# Patient Record
Sex: Female | Born: 1960 | Race: White | Hispanic: No | Marital: Married | Smoking: Never smoker
Health system: Southern US, Community
[De-identification: ages and names within clinical notes are randomized; demographics above are authoritative.]

## PROBLEM LIST (undated history)

## (undated) DIAGNOSIS — C50919 Malignant neoplasm of unspecified site of unspecified female breast: Secondary | ICD-10-CM

## (undated) DIAGNOSIS — C73 Malignant neoplasm of thyroid gland: Secondary | ICD-10-CM

## (undated) DIAGNOSIS — J40 Bronchitis, not specified as acute or chronic: Secondary | ICD-10-CM

## (undated) HISTORY — DX: Malignant neoplasm of unspecified site of unspecified female breast: C50.919

## (undated) HISTORY — DX: Malignant neoplasm of thyroid gland: C73

## (undated) HISTORY — PX: BREAST EXCISIONAL BIOPSY: SUR124

## (undated) HISTORY — PX: THYROID SURGERY: SHX805

## (undated) HISTORY — PX: TONSILLECTOMY: SUR1361

---

## 2015-03-16 DIAGNOSIS — C73 Malignant neoplasm of thyroid gland: Secondary | ICD-10-CM | POA: Insufficient documentation

## 2015-07-07 DIAGNOSIS — E039 Hypothyroidism, unspecified: Secondary | ICD-10-CM | POA: Insufficient documentation

## 2015-07-07 DIAGNOSIS — R944 Abnormal results of kidney function studies: Secondary | ICD-10-CM | POA: Insufficient documentation

## 2016-06-03 HISTORY — PX: BREAST LUMPECTOMY: SHX2

## 2016-06-03 HISTORY — PX: BREAST BIOPSY: SHX20

## 2016-09-12 DIAGNOSIS — G510 Bell's palsy: Secondary | ICD-10-CM | POA: Insufficient documentation

## 2016-09-12 DIAGNOSIS — N39490 Overflow incontinence: Secondary | ICD-10-CM | POA: Insufficient documentation

## 2016-09-12 DIAGNOSIS — E7800 Pure hypercholesterolemia, unspecified: Secondary | ICD-10-CM | POA: Insufficient documentation

## 2016-11-01 HISTORY — PX: BREAST BIOPSY: SHX20

## 2018-03-03 HISTORY — PX: BREAST BIOPSY: SHX20

## 2018-06-03 HISTORY — PX: BREAST LUMPECTOMY: SHX2

## 2019-02-25 ENCOUNTER — Encounter: Payer: Self-pay | Admitting: Oncology

## 2019-02-25 ENCOUNTER — Other Ambulatory Visit: Payer: Self-pay

## 2019-02-25 NOTE — Progress Notes (Signed)
Called patient for new consult visit tomorrow.  Reviewed chart with pt.

## 2019-02-26 ENCOUNTER — Other Ambulatory Visit: Payer: Self-pay

## 2019-02-26 ENCOUNTER — Encounter (INDEPENDENT_AMBULATORY_CARE_PROVIDER_SITE_OTHER): Payer: Self-pay

## 2019-02-26 ENCOUNTER — Encounter: Payer: Self-pay | Admitting: Oncology

## 2019-02-26 ENCOUNTER — Inpatient Hospital Stay: Payer: BC Managed Care – PPO | Attending: Oncology | Admitting: Oncology

## 2019-02-26 VITALS — BP 138/72 | HR 57 | Temp 98.1°F | Ht 61.0 in | Wt 138.9 lb

## 2019-02-26 DIAGNOSIS — Z85850 Personal history of malignant neoplasm of thyroid: Secondary | ICD-10-CM | POA: Diagnosis not present

## 2019-02-26 DIAGNOSIS — D0511 Intraductal carcinoma in situ of right breast: Secondary | ICD-10-CM | POA: Insufficient documentation

## 2019-02-26 DIAGNOSIS — Z170 Estrogen receptor positive status [ER+]: Secondary | ICD-10-CM | POA: Insufficient documentation

## 2019-02-26 DIAGNOSIS — C73 Malignant neoplasm of thyroid gland: Secondary | ICD-10-CM

## 2019-02-26 DIAGNOSIS — Z801 Family history of malignant neoplasm of trachea, bronchus and lung: Secondary | ICD-10-CM

## 2019-02-26 DIAGNOSIS — Z808 Family history of malignant neoplasm of other organs or systems: Secondary | ICD-10-CM

## 2019-02-26 DIAGNOSIS — Z923 Personal history of irradiation: Secondary | ICD-10-CM

## 2019-02-26 DIAGNOSIS — Z79811 Long term (current) use of aromatase inhibitors: Secondary | ICD-10-CM | POA: Insufficient documentation

## 2019-02-26 NOTE — Progress Notes (Signed)
[place]  [phone] [phone]  ID: Kathy Ruiz OB: September 07, 1960  MR#: [id number]  JKD#:[id number]  Patient Care Team: Leonel Ramsay, MD as PCP - General (Infectious Diseases)  CHIEF COMPLAINT: DCIS right breast, history of papillary thyroid cancer.  INTERVAL HISTORY: Patient is a 58 year old female who recently moved to [facility] from [place] who is here to establish care.  She was diagnosed with DCIS in 2018 and underwent right lumpectomy followed by adjuvant XRT in May 2018.  She was subsequently placed on letrozole.  She also has a distant history of papillary thyroid carcinoma requiring multiple surgeries and radioactive iodine for removal most recently in 2004.  She currently feels well and is asymptomatic.  She has no neurologic complaints.  She denies any recent fevers or illnesses.  She has a good appetite and denies weight loss.  She has no chest pain, shortness of breath, cough, or hemoptysis.  She has no nausea, vomiting, constipation, diarrhea.  She has no urinary complaints.  Patient feels at her baseline offers no specific complaints today.  REVIEW OF SYSTEMS:   Review of Systems  Constitutional: Negative.  Negative for fever, malaise/fatigue and weight loss.  Respiratory: Negative.  Negative for cough, hemoptysis and shortness of breath.   Cardiovascular: Negative.  Negative for chest pain and leg swelling.  Gastrointestinal: Negative.  Negative for abdominal pain.  Genitourinary: Negative.  Negative for frequency.  Musculoskeletal: Negative.  Negative for back pain.  Skin: Negative.  Negative for rash.  Neurological: Negative.  Negative for dizziness, focal weakness, weakness and headaches.  Psychiatric/Behavioral: Negative.  The patient is not nervous/anxious.     As per HPI. Otherwise, a complete review of systems is negative.  PAST MEDICAL HISTORY: Past Medical History:  Diagnosis Date  . Breast cancer  ([place])   . Thyroid cancer ([place])     PAST SURGICAL HISTORY: Past Surgical History:  Procedure Laterality Date  . BREAST BIOPSY    . THYROID SURGERY    . TONSILLECTOMY      FAMILY HISTORY: Family History  Problem Relation Age of Onset  . Lung cancer Mother   . Throat cancer Maternal Aunt     ADVANCED DIRECTIVES (Y/N):  N  HEALTH MAINTENANCE: Social History   Tobacco Use  . Smoking status: Never Smoker  . Smokeless tobacco: Never Used  Substance Use Topics  . Alcohol use: Yes    Alcohol/week: 2.0 standard drinks    Types: 1 Glasses of wine, 1 Shots of liquor per week    Comment: mixture, 1 per wk   . Drug use: Not Currently     Colonoscopy:  PAP:  Bone density:  Lipid panel:  Allergies  Allergen Reactions  . Ibuprofen     Bleeding ulcer    Current Outpatient Medications  Medication Sig Dispense Refill  . Ascorbic Acid (VITAMIN C) 1000 MG tablet Take 1,000 mg by mouth daily.    . Biotin 1000 MCG CHEW Chew 1,000 mcg by mouth.    . Calcium-Magnesium-Vitamin D (CALCIUM MAGNESIUM PO) Take by mouth.    . Coenzyme Q10 (CO Q 10 PO) Take by mouth.    . letrozole (FEMARA) 2.5 MG tablet Take 2.5 mg by mouth daily.    Marland Kitchen levothyroxine (LEVOXYL) 100 MCG tablet Take 100 mcg by mouth daily before breakfast.    . Multiple Vitamins-Minerals (CENTRUM ADULTS PO) Take by mouth.    . Omega-3 Fatty Acids (FISH OIL) 1200 MG CAPS Take by mouth.    Marland Kitchen  VITAMIN D PO Take 5,000 Units by mouth.     No current facility-administered medications for this visit.     OBJECTIVE: Vitals:   02/26/19 0922  BP: 138/72  Pulse: (!) 57  Temp: 98.1 F (36.7 C)     Body mass index is 26.24 kg/m.    ECOG FS:0 - Asymptomatic  General: Well-developed, well-nourished, no acute distress. Eyes: Pink conjunctiva, anicteric sclera. HEENT: Normocephalic, moist mucous membranes, clear oropharnyx.  No palpable lymphadenopathy or evidence of recurrence. Breast: Bilateral breast and axilla without lumps  or masses. Lungs: Clear to auscultation bilaterally. Heart: Regular rate and rhythm. No rubs, murmurs, or gallops. Abdomen: Soft, nontender, nondistended. No organomegaly noted, normoactive bowel sounds. Musculoskeletal: No edema, cyanosis, or clubbing. Neuro: Alert, answering all questions appropriately. Cranial nerves grossly intact. Skin: No rashes or petechiae noted. Psych: Normal affect. Lymphatics: No cervical, calvicular, axillary or inguinal LAD.   LAB RESULTS:  No results found for: NA, K, CL, CO2, GLUCOSE, BUN, CREATININE, CALCIUM, PROT, ALBUMIN, AST, ALT, ALKPHOS, BILITOT, GFRNONAA, GFRAA  No results found for: WBC, NEUTROABS, HGB, HCT, MCV, PLT   STUDIES: No results found.  ASSESSMENT: DCIS right breast, history of papillary thyroid cancer.  PLAN:    1.  DCIS right breast: Patient was diagnosed in 2018 and underwent right lumpectomy followed by adjuvant XRT in May 2018.  She was subsequently placed on letrozole and will require 5 years of treatment completing in May 2023.  She reports her most recent mammogram was greater than 1 year ago, therefore will get diagnostic mammogram in the next 1 to 2 weeks.  Return to clinic in 6 months for routine evaluation. 2.  History of papillary thyroid cancer: Initially diagnosed in 1999 and patient had 2 re-excisions since then most recently in 2004.  She also reports she underwent radioactive iodine treatments.  No intervention is needed at this time.  Continue to monitor thyroglobulin antibodies as well as TSH and thyroid panel. 3.  Bone health: Patient reports she has a bone mineral density later today.  I spent a total of 60 minutes face-to-face with the patient of which greater than 50% of the visit was spent in counseling and coordination of care as detailed above.  Patient expressed understanding and was in agreement with this plan. She also understands that She can call clinic at any time with any questions, concerns, or  complaints.   Cancer Staging Ductal carcinoma in situ (DCIS) of right breast Staging form: Breast, AJCC 8th Edition - Clinical stage from 02/26/2019: Stage 0 (cTis (DCIS), cN0, cM0, ER+, PR: Unknown, HER2: Unknown) - Signed by Lloyd Huger, MD on 02/26/2019   Lloyd Huger, MD   02/26/2019 1:16 PM

## 2019-02-26 NOTE — Progress Notes (Signed)
Patient is here today to establish care for her right breast cancer.

## 2019-04-21 ENCOUNTER — Ambulatory Visit
Admission: RE | Admit: 2019-04-21 | Discharge: 2019-04-21 | Disposition: A | Discharge status: Home / Self Care | Payer: BC Managed Care – PPO | Source: Ambulatory Visit | Attending: Oncology | Admitting: Oncology

## 2019-04-21 ENCOUNTER — Encounter: Payer: Self-pay | Admitting: Oncology

## 2019-04-21 DIAGNOSIS — D0511 Intraductal carcinoma in situ of right breast: Secondary | ICD-10-CM | POA: Diagnosis not present

## 2019-04-22 ENCOUNTER — Other Ambulatory Visit: Payer: Self-pay | Admitting: Emergency Medicine

## 2019-04-22 DIAGNOSIS — D0511 Intraductal carcinoma in situ of right breast: Secondary | ICD-10-CM

## 2019-04-27 ENCOUNTER — Ambulatory Visit (INDEPENDENT_AMBULATORY_CARE_PROVIDER_SITE_OTHER): Payer: BC Managed Care – PPO | Admitting: Surgery

## 2019-04-27 ENCOUNTER — Other Ambulatory Visit: Payer: Self-pay

## 2019-04-27 ENCOUNTER — Encounter: Payer: Self-pay | Admitting: Surgery

## 2019-04-27 VITALS — BP 123/79 | HR 62 | Temp 98.0°F | Resp 14 | Ht 62.0 in | Wt 140.8 lb

## 2019-04-27 DIAGNOSIS — N6489 Other specified disorders of breast: Secondary | ICD-10-CM

## 2019-04-27 NOTE — H&P (View-Only) (Signed)
Patient ID: Kathy Ruiz, female   DOB: 04-27-1961, 58 y.o.   MRN: [id number]  Chief Complaint: Radial scar left breast.  History of Present Illness [name] is a 58 y.o. female with history of right-sided ductal carcinoma in situ following breast conservation in 2018.  She continues to take letrozole.  She recently had follow-up mammography which revisits a known radial scar on the left breast.  Although this was previously biopsied, excisional biopsy was not pursued.  She notes no particular change in the left breast, and none is reported radiographically.  She has been encouraged to have excisional biopsy of this area.   Past Medical History Past Medical History:  Diagnosis Date  . Breast cancer ([place])   . Thyroid cancer [facility])       Past Surgical History:  Procedure Laterality Date  . BREAST BIOPSY Right 2018   dcis  . BREAST BIOPSY Left 11/2016   MRI bx Sclerosing Adenosis  . BREAST BIOPSY Left 03/2018   stereo radial sclerosing lesion, fibroadenomatoid lesion  . BREAST EXCISIONAL BIOPSY Left    age 60  . BREAST LUMPECTOMY Right 2018   dcis  . THYROID SURGERY    . TONSILLECTOMY      Allergies  Allergen Reactions  . Ibuprofen     Bleeding ulcer    Current Outpatient Medications  Medication Sig Dispense Refill  . albuterol (PROAIR HFA) 108 (90 Base) MCG/ACT inhaler ProAir HFA 90 mcg/actuation aerosol inhaler    . Ascorbic Acid (VITAMIN C) 1000 MG tablet Take 1,000 mg by mouth daily.    . Biotin 1000 MCG CHEW Chew 1,000 mcg by mouth.    . Calcium-Magnesium 100-50 MG TABS Calcium Magnesium    . Calcium-Magnesium-Vitamin D (CALCIUM MAGNESIUM PO) Take by mouth.    . Coenzyme Q10 (CO Q 10 PO) Take by mouth.    . letrozole (FEMARA) 2.5 MG tablet Take 2.5 mg by mouth daily.    Marland Kitchen levothyroxine (LEVOXYL) 100 MCG tablet Take 100 mcg by mouth daily before breakfast.    . Magnesium Chloride-Calcium 64-106 MG TBEC Take by mouth.    . Multiple Vitamins-Minerals (CENTRUM  ADULTS PO) Take by mouth.    . Omega-3 Fatty Acids (FISH OIL) 1200 MG CAPS Take by mouth.     No current facility-administered medications for this visit.     Family History Family History  Problem Relation Age of Onset  . Lung cancer Mother   . Throat cancer Maternal Aunt   . Breast cancer Cousin 50      Social History Social History   Tobacco Use  . Smoking status: Never Smoker  . Smokeless tobacco: Never Used  Substance Use Topics  . Alcohol use: Yes    Alcohol/week: 2.0 standard drinks    Types: 1 Glasses of wine, 1 Shots of liquor per week    Comment: mixture, 1 per wk   . Drug use: Never        Review of Systems  Constitutional: Negative.   HENT: Negative.   Eyes: Negative.   Respiratory: Negative.   Cardiovascular: Negative.   Gastrointestinal: Negative.   Genitourinary: Negative.   Skin: Negative.   Neurological: Negative.   Endo/Heme/Allergies: Negative.       Physical Exam Blood pressure 123/79, pulse 62, temperature 98 F (36.7 C), temperature source Temporal, resp. rate 14, height 5\' 2"  (1.575 m), weight 140 lb 12.8 oz (63.9 kg), SpO2 98 %. Last Weight  Most recent update: 04/27/2019  1:38 PM  Weight  63.9 kg (140 lb 12.8 oz)            CONSTITUTIONAL: Well developed, and nourished, appropriately responsive and aware without distress.   EYES: Sclera non-icteric.   EARS, NOSE, MOUTH AND THROAT: Mask worn.  NECK: Trachea is midline, and there is no jugular venous distension.  LYMPH NODES:  Lymph nodes in the neck are not enlarged. RESPIRATORY:  Lungs are clear, and breath sounds are equal bilaterally. Normal respiratory effort without pathologic use of accessory muscles. CARDIOVASCULAR: Heart is regular in rate and rhythm. GI: The abdomen is  soft, nontender, and nondistended.  GU:  Remarkable paucity of scarring and radiation changes to the right breast.  The left breast is unremarkable, there is no evidence of suspicious nodularity, density  skin changes or mass.  Nipples everted without evidence of discharge.  No gross axillary adenopathy. MUSCULOSKELETAL:  Symmetrical muscle tone appreciated in all four extremities.    SKIN: Skin turgor is normal. No pathologic skin lesions appreciated.  NEUROLOGIC:  Motor and sensation appear grossly normal.  Cranial nerves are grossly without defect. PSYCH:  Alert and oriented to person, place and time. Affect is appropriate for situation.  Data Reviewed I have personally reviewed what is currently available of the patient's imaging, recent labs and medical records.   CLINICAL DATA:  History of right breast cancer in 2018 status post lumpectomy. History of stereotactic left breast biopsy in April 2019 demonstrating radial sclerosing lesion which was not excised. Additional history of benign MRI guided biopsy in June 2018 demonstrating benign sclerosing adenosis, UDH and fat necrosis.  EXAM: DIGITAL DIAGNOSTIC BILATERAL MAMMOGRAM WITH CAD AND TOMO  ULTRASOUND LEFT BREAST  COMPARISON:  Previous exam(s).  ACR Breast Density Category b: There are scattered areas of fibroglandular density.  FINDINGS: Mammogram:  Right breast: There are postsurgical changes following lumpectomy in the medial right breast. No suspicious mass, distortion, or microcalcifications are identified to suggest presence of malignancy.  Left breast: There are two biopsy marking clips, a cylinder in the anterior slightly superior breast and a butterfly clip in the upper outer breast. In the upper outer breast there is a persistent small area of distortion at the butterfly clip, likely corresponding to the biopsy-proven radial sclerosing lesion. There is mildly increased density in this location which may represent biopsy change. The biopsy marking clip on the MLO appears displaced by approximately 1 cm superior to the center of the distortion (which was also noted on a prior outside report). No other  findings identified in the left breast.  Mammographic images were processed with CAD.  Ultrasound:  Targeted ultrasound is performed in the upper outer left breast at 1 o'clock 5 cm from the nipple demonstrating a small irregular mass measuring 0.7 x 0.5 x 0.6 cm with an associated echogenic focus, likely the biopsy marking clip.  IMPRESSION: 1. At the site of biopsy proven radial sclerosing lesion, which was not excised, in the upper outer left breast (butterfly clip) there is distortion which is not significantly changed. There is a small amount of increased density and sonographically there is a small irregular possibly representing post biopsy change. Of note the biopsy marking clip appears displaced approximately 1 cm from the center of the distortion on the MLO view.  2. Expected postoperative changes in the right breast. No mammographic evidence of malignancy in the right breast.  RECOMMENDATION: 1. Follow-up left breast diagnostic mammogram and ultrasound in 6 months to confirm stability of the likely post biopsy  changes in the upper outer breast.  2. Recommend surgical consultation for consideration of excision of the radial sclerosing lesion in the left breast.  I have discussed the findings and recommendations with the patient. If applicable, a reminder letter will be sent to the patient regarding the next appointment.  BI-RADS CATEGORY  3: Probably benign.   Electronically Signed   By: Audie Pinto M.D.   On: 04/21/2019 11:23  Assessment    Radial sclerosing lesion left breast.  Seemingly unchanged from prior imaging. Patient Active Problem List   Diagnosis Date Noted  . Ductal carcinoma in situ (DCIS) of right breast 02/26/2019  . Thyroid cancer ([place]) 02/26/2019  . Bell's palsy 09/12/2016  . High cholesterol 09/12/2016  . Incontinence overflow, urine 09/12/2016  . Acquired hypothyroidism 07/07/2015  . Decreased GFR 07/07/2015     Plan    Needle localized left breast biopsy of the lesion in question to enable further peace of mind for this patient.  Risks and benefits of the above procedure been discussed, including the details of how the wires placed.  We also talks about an anxiolytics for her procedure.  She would like to proceed.  Risks of anesthesia, bleeding, bruising, missing the lesion and need for additional biopsy discussed in detail.  She understands that the likelihood of this biopsy leans heavily toward confirming its benignity but feels it is well worthwhile considering the risk  Face-to-face time spent with the patient and accompanying care providers(if present) was 30 minutes, with more than 50% of the time spent counseling, educating, and coordinating care of the patient.      Ronny Bacon 04/27/2019, 3:05 PM

## 2019-04-27 NOTE — Progress Notes (Signed)
Patient ID: Kathy Ruiz, female   DOB: [birth year], 58 y.o.   MRN: [id number]  Chief Complaint: Radial scar left breast.  History of Present Illness Kathy Ruiz is a 58 y.o. female with history of right-sided ductal carcinoma in situ following breast conservation in 2018.  She continues to take letrozole.  She recently had follow-up mammography which revisits a known radial scar on the left breast.  Although this was previously biopsied, excisional biopsy was not pursued.  She notes no particular change in the left breast, and none is reported radiographically.  She has been encouraged to have excisional biopsy of this area.   Past Medical History Past Medical History:  Diagnosis Date  . Breast cancer ([place])   . Thyroid cancer [facility])       Past Surgical History:  Procedure Laterality Date  . BREAST BIOPSY Right 2018   dcis  . BREAST BIOPSY Left 11/2016   MRI bx Sclerosing Adenosis  . BREAST BIOPSY Left 03/2018   stereo radial sclerosing lesion, fibroadenomatoid lesion  . BREAST EXCISIONAL BIOPSY Left    age 46  . BREAST LUMPECTOMY Right 2018   dcis  . THYROID SURGERY    . TONSILLECTOMY      Allergies  Allergen Reactions  . Ibuprofen     Bleeding ulcer    Current Outpatient Medications  Medication Sig Dispense Refill  . albuterol (PROAIR HFA) 108 (90 Base) MCG/ACT inhaler ProAir HFA 90 mcg/actuation aerosol inhaler    . Ascorbic Acid (VITAMIN C) 1000 MG tablet Take 1,000 mg by mouth daily.    . Biotin 1000 MCG CHEW Chew 1,000 mcg by mouth.    . Calcium-Magnesium 100-50 MG TABS Calcium Magnesium    . Calcium-Magnesium-Vitamin D (CALCIUM MAGNESIUM PO) Take by mouth.    . Coenzyme Q10 (CO Q 10 PO) Take by mouth.    . letrozole (FEMARA) 2.5 MG tablet Take 2.5 mg by mouth daily.    Marland Kitchen levothyroxine (LEVOXYL) 100 MCG tablet Take 100 mcg by mouth daily before breakfast.    . Magnesium Chloride-Calcium 64-106 MG TBEC Take by mouth.    . Multiple Vitamins-Minerals (CENTRUM  ADULTS PO) Take by mouth.    . Omega-3 Fatty Acids (FISH OIL) 1200 MG CAPS Take by mouth.     No current facility-administered medications for this visit.     Family History Family History  Problem Relation Age of Onset  . Lung cancer Mother   . Throat cancer Maternal Aunt   . Breast cancer Cousin 72      Social History Social History   Tobacco Use  . Smoking status: Never Smoker  . Smokeless tobacco: Never Used  Substance Use Topics  . Alcohol use: Yes    Alcohol/week: 2.0 standard drinks    Types: 1 Glasses of wine, 1 Shots of liquor per week    Comment: mixture, 1 per wk   . Drug use: Never        Review of Systems  Constitutional: Negative.   HENT: Negative.   Eyes: Negative.   Respiratory: Negative.   Cardiovascular: Negative.   Gastrointestinal: Negative.   Genitourinary: Negative.   Skin: Negative.   Neurological: Negative.   Endo/Heme/Allergies: Negative.       Physical Exam Blood pressure 123/79, pulse 62, temperature 98 F (36.7 C), temperature source Temporal, resp. rate 14, height 5\' 2"  (1.575 m), weight 140 lb 12.8 oz (63.9 kg), SpO2 98 %. Last Weight  Most recent update: 04/27/2019  1:38 PM  Weight  63.9 kg (140 lb 12.8 oz)            CONSTITUTIONAL: Well developed, and nourished, appropriately responsive and aware without distress.   EYES: Sclera non-icteric.   EARS, NOSE, MOUTH AND THROAT: Mask worn.  NECK: Trachea is midline, and there is no jugular venous distension.  LYMPH NODES:  Lymph nodes in the neck are not enlarged. RESPIRATORY:  Lungs are clear, and breath sounds are equal bilaterally. Normal respiratory effort without pathologic use of accessory muscles. CARDIOVASCULAR: Heart is regular in rate and rhythm. GI: The abdomen is  soft, nontender, and nondistended.  GU:  Remarkable paucity of scarring and radiation changes to the right breast.  The left breast is unremarkable, there is no evidence of suspicious nodularity, density  skin changes or mass.  Nipples everted without evidence of discharge.  No gross axillary adenopathy. MUSCULOSKELETAL:  Symmetrical muscle tone appreciated in all four extremities.    SKIN: Skin turgor is normal. No pathologic skin lesions appreciated.  NEUROLOGIC:  Motor and sensation appear grossly normal.  Cranial nerves are grossly without defect. PSYCH:  Alert and oriented to person, place and time. Affect is appropriate for situation.  Data Reviewed I have personally reviewed what is currently available of the patient's imaging, recent labs and medical records.   CLINICAL DATA:  History of right breast cancer in 2018 status post lumpectomy. History of stereotactic left breast biopsy in April 2019 demonstrating radial sclerosing lesion which was not excised. Additional history of benign MRI guided biopsy in June 2018 demonstrating benign sclerosing adenosis, UDH and fat necrosis.  EXAM: DIGITAL DIAGNOSTIC BILATERAL MAMMOGRAM WITH CAD AND TOMO  ULTRASOUND LEFT BREAST  COMPARISON:  Previous exam(s).  ACR Breast Density Category b: There are scattered areas of fibroglandular density.  FINDINGS: Mammogram:  Right breast: There are postsurgical changes following lumpectomy in the medial right breast. No suspicious mass, distortion, or microcalcifications are identified to suggest presence of malignancy.  Left breast: There are two biopsy marking clips, a cylinder in the anterior slightly superior breast and a butterfly clip in the upper outer breast. In the upper outer breast there is a persistent small area of distortion at the butterfly clip, likely corresponding to the biopsy-proven radial sclerosing lesion. There is mildly increased density in this location which may represent biopsy change. The biopsy marking clip on the MLO appears displaced by approximately 1 cm superior to the center of the distortion (which was also noted on a prior outside report). No other  findings identified in the left breast.  Mammographic images were processed with CAD.  Ultrasound:  Targeted ultrasound is performed in the upper outer left breast at 1 o'clock 5 cm from the nipple demonstrating a small irregular mass measuring 0.7 x 0.5 x 0.6 cm with an associated echogenic focus, likely the biopsy marking clip.  IMPRESSION: 1. At the site of biopsy proven radial sclerosing lesion, which was not excised, in the upper outer left breast (butterfly clip) there is distortion which is not significantly changed. There is a small amount of increased density and sonographically there is a small irregular possibly representing post biopsy change. Of note the biopsy marking clip appears displaced approximately 1 cm from the center of the distortion on the MLO view.  2. Expected postoperative changes in the right breast. No mammographic evidence of malignancy in the right breast.  RECOMMENDATION: 1. Follow-up left breast diagnostic mammogram and ultrasound in 6 months to confirm stability of the likely post biopsy  changes in the upper outer breast.  2. Recommend surgical consultation for consideration of excision of the radial sclerosing lesion in the left breast.  I have discussed the findings and recommendations with the patient. If applicable, a reminder letter will be sent to the patient regarding the next appointment.  BI-RADS CATEGORY  3: Probably benign.   Electronically Signed   By: Audie Pinto M.D.   On: 04/21/2019 11:23  Assessment    Radial sclerosing lesion left breast.  Seemingly unchanged from prior imaging. Patient Active Problem List   Diagnosis Date Noted  . Ductal carcinoma in situ (DCIS) of right breast 02/26/2019  . Thyroid cancer ([place]) 02/26/2019  . Bell's palsy 09/12/2016  . High cholesterol 09/12/2016  . Incontinence overflow, urine 09/12/2016  . Acquired hypothyroidism 07/07/2015  . Decreased GFR 07/07/2015     Plan    Needle localized left breast biopsy of the lesion in question to enable further peace of mind for this patient.  Risks and benefits of the above procedure been discussed, including the details of how the wires placed.  We also talks about an anxiolytics for her procedure.  She would like to proceed.  Risks of anesthesia, bleeding, bruising, missing the lesion and need for additional biopsy discussed in detail.  She understands that the likelihood of this biopsy leans heavily toward confirming its benignity but feels it is well worthwhile considering the risk  Face-to-face time spent with the patient and accompanying care providers(if present) was 30 minutes, with more than 50% of the time spent counseling, educating, and coordinating care of the patient.      Ronny Bacon 04/27/2019, 3:05 PM

## 2019-04-27 NOTE — Patient Instructions (Signed)
Our surgery scheduler will call you within 24/48 hours to schedule your surgery. Please have the Blue sheet available when speaking with her.

## 2019-04-28 ENCOUNTER — Other Ambulatory Visit: Payer: Self-pay | Admitting: Surgery

## 2019-04-28 DIAGNOSIS — N6489 Other specified disorders of breast: Secondary | ICD-10-CM

## 2019-05-03 ENCOUNTER — Telehealth: Payer: Self-pay | Admitting: Surgery

## 2019-05-03 ENCOUNTER — Ambulatory Visit: Payer: Self-pay | Admitting: Surgery

## 2019-05-03 DIAGNOSIS — N6489 Other specified disorders of breast: Secondary | ICD-10-CM

## 2019-05-03 NOTE — Telephone Encounter (Signed)
I have called patient to go over surgery info below. No answer. I have left a message on patient's voicemail to call the office back.   Surgery Date: 05/14/19 with Dr Ulysees Barns breast biopsy with NL. Patient to arrive at 8:30am at [facility] the day of surgery.  Preadmission Testing Date: 05/06/19 between 8-1:00pm-phone interview.  Covid Testing Date: 05/11/19 between 8-10:30am - patient advised to go to the [facility] ([place])  [employer] Video sent via [employer] and [employer].

## 2019-05-04 NOTE — Telephone Encounter (Signed)
I have spoke with patient about surgery date. Patient would like to have surgery on 05/12/19.  Pt has been advised of pre admission date/time, Covid Testing date and Surgery date.  Surgery Date: 05/12/19 with Dr Ulysees Barns breast biopsy with NL. Patient to arrive at 7:45am at [facility] the morning of surgery.  Preadmission Testing Date: 05/06/19 between 8-1:00pm-phone interview.  Covid Testing Date: 05/07/19 between 8-10:30am - patient advised to go to the [facility] ([place])  [employer] sent via [employer] and [employer].

## 2019-05-06 ENCOUNTER — Encounter
Admission: RE | Admit: 2019-05-06 | Discharge: 2019-05-06 | Disposition: A | Discharge status: Home / Self Care | Payer: BC Managed Care – PPO | Source: Ambulatory Visit | Attending: Surgery | Admitting: Surgery

## 2019-05-06 ENCOUNTER — Other Ambulatory Visit: Payer: Self-pay

## 2019-05-06 HISTORY — DX: Bronchitis, not specified as acute or chronic: J40

## 2019-05-06 NOTE — Patient Instructions (Signed)
Your procedure is scheduled on: 05/12/19 Report to [facility]. DAY SURGERY [phone] .  Remember: Instructions that are not followed completely may result in serious medical risk, up to and including death, or upon the discretion of your surgeon and anesthesiologist your surgery may need to be rescheduled.     _X__ 1. Do not eat food after midnight the night before your procedure.                 No gum chewing or hard candies. You may drink clear liquids up to 2 hours                 before you are scheduled to arrive for your surgery- DO not drink clear                 liquids within 2 hours of the start of your surgery.                 Clear Liquids include:  water, apple juice without pulp, clear carbohydrate                 drink such as Clearfast or Gatorade, Black Coffee or Tea (Do not add                 anything to coffee or tea). Diabetics water only  __X__2.  On the morning of surgery brush your teeth with toothpaste and water, you                 may rinse your mouth with mouthwash if you wish.  Do not swallow any              toothpaste of mouthwash.     _X__ 3.  No Alcohol for 24 hours before or after surgery.   _X__ 4.  Do Not Smoke or use e-cigarettes For 24 Hours Prior to Your Surgery.                 Do not use any chewable tobacco products for at least 6 hours prior to                 surgery.  ____  5.  Bring all medications with you on the day of surgery if instructed.   __X__  6.  Notify your doctor if there is any change in your medical condition      (cold, fever, infections).     Do not wear jewelry, make-up, hairpins, clips or nail polish. Do not wear lotions, powders, or perfumes.  Do not shave 48 hours prior to surgery. Men may shave face and neck. Do not bring valuables to the hospital.    [facility] is not responsible for any belongings or valuables.  Contacts, dentures/partials or body piercings may not be worn  into surgery. Bring a case for your contacts, glasses or hearing aids, a denture cup will be supplied. Leave your suitcase in the car. After surgery it may be brought to your room. For patients admitted to the hospital, discharge time is determined by your treatment team.   Patients discharged the day of surgery will not be allowed to drive home.   Please read over the following fact sheets that you were given:   MRSA Information  __X__ Take these medicines the morning of surgery with A SIP OF WATER:    1. levothyroxine (LEVOXYL) 100 MCG tablet  2.   3.   4.  5.  6.  ____ Fleet Enema (as directed)   __X__ Use CHG Soap/SAGE wipes as directed  __X__ Use inhalers on the day of surgery  ____ Stop metformin/Janumet/Farxiga 2 days prior to surgery    ____ Take 1/2 of usual insulin dose the night before surgery. No insulin the morning          of surgery.   ____ Stop Blood Thinners Coumadin/Plavix/Xarelto/Pleta/Pradaxa/Eliquis/Effient/Aspirin  on   Or contact your Surgeon, Cardiologist or Medical Doctor regarding  ability to stop your blood thinners  __X__ Stop Anti-inflammatories 7 days before surgery such as Advil, Ibuprofen, Motrin,  BC or Goodies Powder, Naprosyn, Naproxen, Aleve, Aspirin    __X__ Stop all herbal supplements, fish oil or vitamin E until after surgery. STOP CO Q 10, BIOTIN, VITAMIN C    ____ Bring C-Pap to the hospital.

## 2019-05-07 ENCOUNTER — Other Ambulatory Visit
Admission: RE | Admit: 2019-05-07 | Discharge: 2019-05-07 | Disposition: A | Discharge status: Home / Self Care | Payer: BC Managed Care – PPO | Source: Ambulatory Visit | Attending: Surgery | Admitting: Surgery

## 2019-05-07 DIAGNOSIS — Z01812 Encounter for preprocedural laboratory examination: Secondary | ICD-10-CM | POA: Diagnosis present

## 2019-05-07 DIAGNOSIS — Z20828 Contact with and (suspected) exposure to other viral communicable diseases: Secondary | ICD-10-CM | POA: Insufficient documentation

## 2019-05-07 LAB — SARS CORONAVIRUS 2 (TAT 6-24 HRS): SARS Coronavirus 2: NEGATIVE

## 2019-05-11 ENCOUNTER — Other Ambulatory Visit: Payer: BC Managed Care – PPO

## 2019-05-12 ENCOUNTER — Encounter
Admission: RE | Disposition: A | Discharge status: Home / Self Care | Payer: Self-pay | Source: Home / Self Care | Attending: Surgery

## 2019-05-12 ENCOUNTER — Other Ambulatory Visit: Payer: Self-pay

## 2019-05-12 ENCOUNTER — Ambulatory Visit
Admission: RE | Admit: 2019-05-12 | Discharge: 2019-05-12 | Disposition: A | Discharge status: Home / Self Care | Payer: BC Managed Care – PPO | Source: Ambulatory Visit | Attending: Surgery | Admitting: Surgery

## 2019-05-12 ENCOUNTER — Ambulatory Visit: Payer: BC Managed Care – PPO | Admitting: Anesthesiology

## 2019-05-12 ENCOUNTER — Ambulatory Visit
Admission: RE | Admit: 2019-05-12 | Discharge: 2019-05-12 | Disposition: A | Discharge status: Home / Self Care | Payer: BC Managed Care – PPO | Attending: Surgery | Admitting: Surgery

## 2019-05-12 DIAGNOSIS — N6489 Other specified disorders of breast: Secondary | ICD-10-CM

## 2019-05-12 DIAGNOSIS — D242 Benign neoplasm of left breast: Secondary | ICD-10-CM

## 2019-05-12 DIAGNOSIS — Z800 Family history of malignant neoplasm of digestive organs: Secondary | ICD-10-CM | POA: Insufficient documentation

## 2019-05-12 DIAGNOSIS — Z79899 Other long term (current) drug therapy: Secondary | ICD-10-CM | POA: Insufficient documentation

## 2019-05-12 DIAGNOSIS — Z801 Family history of malignant neoplasm of trachea, bronchus and lung: Secondary | ICD-10-CM | POA: Diagnosis not present

## 2019-05-12 DIAGNOSIS — E039 Hypothyroidism, unspecified: Secondary | ICD-10-CM | POA: Diagnosis not present

## 2019-05-12 DIAGNOSIS — Z886 Allergy status to analgesic agent status: Secondary | ICD-10-CM | POA: Insufficient documentation

## 2019-05-12 DIAGNOSIS — L905 Scar conditions and fibrosis of skin: Secondary | ICD-10-CM | POA: Insufficient documentation

## 2019-05-12 DIAGNOSIS — J449 Chronic obstructive pulmonary disease, unspecified: Secondary | ICD-10-CM | POA: Diagnosis not present

## 2019-05-12 DIAGNOSIS — Z85850 Personal history of malignant neoplasm of thyroid: Secondary | ICD-10-CM | POA: Insufficient documentation

## 2019-05-12 DIAGNOSIS — G709 Myoneural disorder, unspecified: Secondary | ICD-10-CM | POA: Insufficient documentation

## 2019-05-12 DIAGNOSIS — Z803 Family history of malignant neoplasm of breast: Secondary | ICD-10-CM | POA: Diagnosis not present

## 2019-05-12 DIAGNOSIS — N6022 Fibroadenosis of left breast: Secondary | ICD-10-CM | POA: Diagnosis not present

## 2019-05-12 DIAGNOSIS — Z853 Personal history of malignant neoplasm of breast: Secondary | ICD-10-CM | POA: Insufficient documentation

## 2019-05-12 DIAGNOSIS — E7800 Pure hypercholesterolemia, unspecified: Secondary | ICD-10-CM | POA: Insufficient documentation

## 2019-05-12 HISTORY — PX: BREAST BIOPSY: SHX20

## 2019-05-12 HISTORY — PX: BREAST EXCISIONAL BIOPSY: SUR124

## 2019-05-12 SURGERY — BREAST BIOPSY WITH NEEDLE LOCALIZATION
Anesthesia: General | Laterality: Left

## 2019-05-12 MED ORDER — ONDANSETRON HCL 4 MG/2ML IJ SOLN: 4.0000 mg | Freq: Once | INTRAMUSCULAR | Status: DC | PRN

## 2019-05-12 MED ORDER — ROCURONIUM BROMIDE 50 MG/5ML IV SOLN
INTRAVENOUS | Status: AC
  Filled 2019-05-12: qty 1

## 2019-05-12 MED ORDER — LIDOCAINE HCL (CARDIAC) PF 100 MG/5ML IV SOSY
PREFILLED_SYRINGE | INTRAVENOUS | Status: DC | PRN
  Administered 2019-05-12: 60 mg via INTRAVENOUS

## 2019-05-12 MED ORDER — FAMOTIDINE 20 MG PO TABS
ORAL_TABLET | ORAL | Status: AC
  Administered 2019-05-12: 20 mg via ORAL
  Filled 2019-05-12: qty 1

## 2019-05-12 MED ORDER — BUPIVACAINE-EPINEPHRINE (PF) 0.25% -1:200000 IJ SOLN
INTRAMUSCULAR | Status: AC
  Filled 2019-05-12: qty 30

## 2019-05-12 MED ORDER — FENTANYL CITRATE (PF) 100 MCG/2ML IJ SOLN
INTRAMUSCULAR | Status: AC
  Filled 2019-05-12: qty 2

## 2019-05-12 MED ORDER — LACTATED RINGERS IV SOLN
INTRAVENOUS | Status: DC
  Administered 2019-05-12 (×2): via INTRAVENOUS

## 2019-05-12 MED ORDER — FENTANYL CITRATE (PF) 100 MCG/2ML IJ SOLN
INTRAMUSCULAR | Status: DC | PRN
  Administered 2019-05-12: 25 ug via INTRAVENOUS

## 2019-05-12 MED ORDER — SUCCINYLCHOLINE CHLORIDE 20 MG/ML IJ SOLN
INTRAMUSCULAR | Status: AC
  Filled 2019-05-12: qty 1

## 2019-05-12 MED ORDER — MIDAZOLAM HCL 2 MG/2ML IJ SOLN
INTRAMUSCULAR | Status: AC
  Filled 2019-05-12: qty 2

## 2019-05-12 MED ORDER — ONDANSETRON HCL 4 MG/2ML IJ SOLN
INTRAMUSCULAR | Status: DC | PRN
  Administered 2019-05-12: 4 mg via INTRAVENOUS

## 2019-05-12 MED ORDER — PROPOFOL 10 MG/ML IV BOLUS
INTRAVENOUS | Status: AC
  Filled 2019-05-12: qty 20

## 2019-05-12 MED ORDER — MIDAZOLAM HCL 2 MG/2ML IJ SOLN
INTRAMUSCULAR | Status: DC | PRN
  Administered 2019-05-12 (×2): 1 mg via INTRAVENOUS

## 2019-05-12 MED ORDER — CHLORHEXIDINE GLUCONATE CLOTH 2 % EX PADS
6.0000 | MEDICATED_PAD | Freq: Once | CUTANEOUS | Status: AC
  Administered 2019-05-12: 6 via TOPICAL

## 2019-05-12 MED ORDER — PROPOFOL 10 MG/ML IV BOLUS
INTRAVENOUS | Status: DC | PRN
  Administered 2019-05-12: 150 mg via INTRAVENOUS

## 2019-05-12 MED ORDER — FENTANYL CITRATE (PF) 100 MCG/2ML IJ SOLN: 25.0000 ug | INTRAMUSCULAR | Status: DC | PRN

## 2019-05-12 MED ORDER — DEXAMETHASONE SODIUM PHOSPHATE 10 MG/ML IJ SOLN
INTRAMUSCULAR | Status: DC | PRN
  Administered 2019-05-12: 8 mg via INTRAVENOUS

## 2019-05-12 MED ORDER — FAMOTIDINE 20 MG PO TABS: 20.0000 mg | ORAL_TABLET | Freq: Once | ORAL | Status: AC

## 2019-05-12 MED ORDER — HYDROCODONE-ACETAMINOPHEN 5-325 MG PO TABS: 2.0000 | ORAL_TABLET | Freq: Four times a day (QID) | ORAL | 0 refills | Status: DC | PRN

## 2019-05-12 MED ORDER — PHENYLEPHRINE HCL (PRESSORS) 10 MG/ML IV SOLN
INTRAVENOUS | Status: DC | PRN
  Administered 2019-05-12: 20 ug via INTRAVENOUS
  Administered 2019-05-12: 80 ug via INTRAVENOUS

## 2019-05-12 MED ORDER — EPHEDRINE SULFATE 50 MG/ML IJ SOLN
INTRAMUSCULAR | Status: DC | PRN
  Administered 2019-05-12: 10 mg via INTRAVENOUS

## 2019-05-12 MED ORDER — BUPIVACAINE-EPINEPHRINE 0.25% -1:200000 IJ SOLN
INTRAMUSCULAR | Status: DC | PRN
  Administered 2019-05-12: 13 mL

## 2019-05-12 MED ORDER — CEFAZOLIN SODIUM-DEXTROSE 2-4 GM/100ML-% IV SOLN: 2.0000 g | INTRAVENOUS | Status: DC

## 2019-05-12 MED ORDER — CEFAZOLIN SODIUM-DEXTROSE 2-4 GM/100ML-% IV SOLN
INTRAVENOUS | Status: AC
  Filled 2019-05-12: qty 100

## 2019-05-12 SURGICAL SUPPLY — 33 items
BINDER BREAST LRG (GAUZE/BANDAGES/DRESSINGS) ×3 IMPLANT
BLADE SURG 15 STRL LF DISP TIS (BLADE) ×1 IMPLANT
BLADE SURG 15 STRL SS (BLADE) ×2
CANISTER SUCT 1200ML W/VALVE (MISCELLANEOUS) ×3 IMPLANT
CHLORAPREP W/TINT 26 (MISCELLANEOUS) ×3 IMPLANT
CNTNR SPEC 2.5X3XGRAD LEK (MISCELLANEOUS) ×1
CONT SPEC 4OZ STER OR WHT (MISCELLANEOUS) ×2
CONTAINER SPEC 2.5X3XGRAD LEK (MISCELLANEOUS) ×1 IMPLANT
COVER WAND RF STERILE (DRAPES) ×3 IMPLANT
DECANTER SPIKE VIAL GLASS SM (MISCELLANEOUS) ×3 IMPLANT
DERMABOND ADVANCED (GAUZE/BANDAGES/DRESSINGS) ×2
DERMABOND ADVANCED .7 DNX12 (GAUZE/BANDAGES/DRESSINGS) ×1 IMPLANT
DEVICE DUBIN SPECIMEN MAMMOGRA (MISCELLANEOUS) ×3 IMPLANT
DRAPE LAPAROTOMY TRNSV 106X77 (MISCELLANEOUS) ×3 IMPLANT
ELECT CAUTERY BLADE TIP 2.5 (TIP) ×3
ELECT REM PT RETURN 9FT ADLT (ELECTROSURGICAL) ×3
ELECTRODE CAUTERY BLDE TIP 2.5 (TIP) ×1 IMPLANT
ELECTRODE REM PT RTRN 9FT ADLT (ELECTROSURGICAL) ×1 IMPLANT
GLOVE ORTHO TXT STRL SZ7.5 (GLOVE) ×9 IMPLANT
GOWN STRL REUS W/ TWL LRG LVL3 (GOWN DISPOSABLE) ×2 IMPLANT
GOWN STRL REUS W/TWL LRG LVL3 (GOWN DISPOSABLE) ×4
KIT MARKER MARGIN INK (KITS) ×3 IMPLANT
KIT TURNOVER KIT A (KITS) ×3 IMPLANT
NEEDLE HYPO 22GX1.5 SAFETY (NEEDLE) ×3 IMPLANT
PACK BASIN MINOR ARMC (MISCELLANEOUS) ×3 IMPLANT
SHEARS HARMONIC 9CM CVD (BLADE) IMPLANT
SUT MNCRL 4-0 (SUTURE) ×2
SUT MNCRL 4-0 27XMFL (SUTURE) ×1
SUT VIC AB 3-0 SH 27 (SUTURE) ×2
SUT VIC AB 3-0 SH 27X BRD (SUTURE) ×1 IMPLANT
SUTURE MNCRL 4-0 27XMF (SUTURE) ×1 IMPLANT
SYR 10ML LL (SYRINGE) ×3 IMPLANT
WATER STERILE IRR 1000ML POUR (IV SOLUTION) ×3 IMPLANT

## 2019-05-12 NOTE — Anesthesia Preprocedure Evaluation (Signed)
Anesthesia Evaluation  Patient identified by MRN, date of birth, ID band Patient awake    Reviewed: Allergy & Precautions, NPO status , Patient's Chart, lab work & pertinent test results  Airway Mallampati: III       Dental   Pulmonary COPD,    Pulmonary exam normal        Cardiovascular negative cardio ROS Normal cardiovascular exam     Neuro/Psych  Neuromuscular disease negative psych ROS   GI/Hepatic negative GI ROS, Neg liver ROS,   Endo/Other  Hypothyroidism   Renal/GU negative Renal ROS  negative genitourinary   Musculoskeletal negative musculoskeletal ROS (+)   Abdominal Normal abdominal exam  (+)   Peds negative pediatric ROS (+)  Hematology negative hematology ROS (+)   Anesthesia Other Findings Past Medical History: No date: Breast cancer (HCC) No date: Bronchitis No date: Thyroid cancer (HCC)  Reproductive/Obstetrics                             Anesthesia Physical Anesthesia Plan  ASA: II  Anesthesia Plan: General   Post-op Pain Management:    Induction: Intravenous  PONV Risk Score and Plan:   Airway Management Planned: LMA  Additional Equipment:   Intra-op Plan:   Post-operative Plan: Extubation in OR  Informed Consent: I have reviewed the patients History and Physical, chart, labs and discussed the procedure including the risks, benefits and alternatives for the proposed anesthesia with the patient or authorized representative who has indicated his/her understanding and acceptance.     Dental advisory given  Plan Discussed with: CRNA and Surgeon  Anesthesia Plan Comments:         Anesthesia Quick Evaluation

## 2019-05-12 NOTE — Anesthesia Procedure Notes (Signed)
Procedure Name: LMA Insertion Date/Time: 05/12/2019 10:50 AM Performed by: Allean Found, CRNA Pre-anesthesia Checklist: Patient identified, Patient being monitored, Timeout performed, Emergency Drugs available and Suction available Patient Re-evaluated:Patient Re-evaluated prior to induction Oxygen Delivery Method: Circle system utilized Preoxygenation: Pre-oxygenation with 100% oxygen Induction Type: IV induction Ventilation: Mask ventilation without difficulty LMA: LMA inserted LMA Size: 4.0 Tube type: Oral Number of attempts: 1 Placement Confirmation: positive ETCO2 and breath sounds checked- equal and bilateral Tube secured with: Tape Dental Injury: Teeth and Oropharynx as per pre-operative assessment

## 2019-05-12 NOTE — Interval H&P Note (Signed)
History and Physical Interval Note:  05/12/2019 10:16 AM  Kathy Ruiz  has presented today for surgery, with the diagnosis of Radial scar left breast.  The various methods of treatment have been discussed with the patient and family. After consideration of risks, benefits and other options for treatment, the patient has consented to  Procedure(s): BREAST BIOPSY WITH NEEDLE LOCALIZATION (Left) as a surgical intervention.  The patient's history has been reviewed, patient examined, no change in status, stable for surgery.  I have reviewed the patient's chart and labs.  Questions were answered to the patient's satisfaction.     Ronny Bacon

## 2019-05-12 NOTE — Anesthesia Postprocedure Evaluation (Signed)
Anesthesia Post Note  Patient: Kathy Ruiz  Procedure(s) Performed: BREAST BIOPSY WITH NEEDLE LOCALIZATION (Left )  Patient location during evaluation: PACU Anesthesia Type: General Level of consciousness: awake and alert and oriented Pain management: pain level controlled Vital Signs Assessment: post-procedure vital signs reviewed and stable Respiratory status: spontaneous breathing Cardiovascular status: blood pressure returned to baseline Anesthetic complications: no     Last Vitals:  Vitals:   05/12/19 1237 05/12/19 1254  BP: 128/69 121/65  Pulse: 78 70  Resp: 16 16  Temp: 36.7 C   SpO2: 99% 97%    Last Pain:  Vitals:   05/12/19 1237  TempSrc: Temporal  PainSc: 0-No pain                 Raiana Pharris

## 2019-05-12 NOTE — Transfer of Care (Signed)
Immediate Anesthesia Transfer of Care Note  Patient: Kathy Ruiz  Procedure(s) Performed: BREAST BIOPSY WITH NEEDLE LOCALIZATION (Left )  Patient Location: PACU  Anesthesia Type:General  Level of Consciousness: sedated and unresponsive  Airway & Oxygen Therapy: Patient Spontanous Breathing and Patient connected to face mask oxygen  Post-op Assessment: Report given to RN and Post -op Vital signs reviewed and stable  Post vital signs: Reviewed and stable  Last Vitals:  Vitals Value Taken Time  BP 96/54 05/12/19 1148  Temp 36.2 C 05/12/19 1147  Pulse 79 05/12/19 1155  Resp 31 05/12/19 1155  SpO2 100 % 05/12/19 1155  Vitals shown include unvalidated device data.  Last Pain:  Vitals:   05/12/19 1147  TempSrc:   PainSc: Asleep         Complications: No apparent anesthesia complications

## 2019-05-12 NOTE — Anesthesia Post-op Follow-up Note (Signed)
Anesthesia QCDR form completed.        

## 2019-05-12 NOTE — Discharge Instructions (Signed)
Your incision was closed with Dermabond.  It is best to keep it clean and dry, it will tolerate a brief shower, but do not soak it or apply any creams or lotions to the incisions.  The Dermabond should gradually flake off over time.  Keep it open to air so you can evaluate your incisions.  Dermabond assists the underlying sutures to keep your incision closed and protected from infection.  Should you develop some drainage from your incision, some drops of drainage would be okay but if it persists continue to put keep a dry dressing over it.  Maintain breast support at all times, except for showering.   AMBULATORY SURGERY  DISCHARGE INSTRUCTIONS   1) The drugs that you were given will stay in your system until tomorrow so for the next 24 hours you should not:  A) Drive an automobile B) Make any legal decisions C) Drink any alcoholic beverage   2) You may resume regular meals tomorrow.  Today it is better to start with liquids and gradually work up to solid foods.  You may eat anything you prefer, but it is better to start with liquids, then soup and crackers, and gradually work up to solid foods.   3) Please notify your doctor immediately if you have any unusual bleeding, trouble breathing, redness and pain at the surgery site, drainage, fever, or pain not relieved by medication.    4) Additional Instructions:        Please contact your physician with any problems or Same Day Surgery at [phone], Monday through Friday 6 am to 4 pm, or [facility] number at [phone].

## 2019-05-12 NOTE — Op Note (Signed)
  Procedure Date:  05/12/2019  Pre-operative Diagnosis: Radial scar left upper outer quadrant breast.  Post-operative Diagnosis: Same  Procedure: Left breast wire-localized lumpectomy.  Surgeon:  Ronny Bacon, M.D., [facility]  Anesthesia:  General endotracheal  Estimated Blood Loss: 10 ml  Specimens: Upper outer quadrant left breast tissue previously needle localized.  Complications: None  Indications for Procedure:  This is a 58 y.o. female who presents with an unchanging imaging of a previously core biopsy area of radial scar in the upper outer quadrant of the left breast.  She has a history of right breast DCIS, and has decided to proceed with excision to reduce/diminish anxiety/risks.  The risks of bleeding, infection, injury to surrounding structures, hematoma, seroma, open wound, cosmetic deformity, and the need for further surgery were all discussed with the patient and was willing to proceed.  Prior to this procedure, the patient had undergone wire localization.  Description of Procedure: The patient was correctly identified in the preoperative area and brought into the operating room.  The patient was placed supine with VTE prophylaxis in place.  Appropriate time-outs were performed.  Anesthesia was induced and the patient was intubated.  Appropriate antibiotics were infused.  The left chest and axilla were prepped and draped in usual sterile fashion.   Attention was turned to the needle localization site where an incision was made either encompassing the needle insertion site, or at a strategically determined site for cosmesis, or direct access to the targeted lesion . Elecrocautery was used for dissection around the superficial tissues, and then scissor dissection is used to perform a lumpectomy with adequate margins.    The specmen's orientation is maintained and the the 6 surfaces painted as designated on the accompanying index.  The specimen including the wire was sent to  radiology which confirmed an intact wire and prior biopsy site clip.  The cavity was irrigated and hemostasis was assured with electrocautery.   The wound was then closed in two layers with 3-0 Vicryl and 4-0 Monocryl and sealed with DermaBond.  Local anesthetic was infiltrated into the the cavity, as depot.   The patient was emerged from anesthesia and extubated and brought to the recovery room for further management.  The patient tolerated the procedure well and all counts were correct at the end of the case.   Ronny Bacon, M.D., [facility] 05/12/2019

## 2019-05-13 LAB — SURGICAL PATHOLOGY

## 2019-05-14 ENCOUNTER — Ambulatory Visit: Payer: BC Managed Care – PPO

## 2019-05-20 ENCOUNTER — Encounter: Payer: Self-pay | Admitting: Oncology

## 2019-05-20 ENCOUNTER — Encounter: Payer: Self-pay | Admitting: Surgery

## 2019-05-20 ENCOUNTER — Other Ambulatory Visit: Payer: Self-pay

## 2019-05-20 ENCOUNTER — Ambulatory Visit (INDEPENDENT_AMBULATORY_CARE_PROVIDER_SITE_OTHER): Payer: BC Managed Care – PPO | Admitting: Surgery

## 2019-05-20 VITALS — BP 130/81 | HR 59 | Temp 97.9°F | Resp 12 | Ht 62.0 in | Wt 142.4 lb

## 2019-05-20 DIAGNOSIS — N6489 Other specified disorders of breast: Secondary | ICD-10-CM

## 2019-05-20 NOTE — Patient Instructions (Addendum)
You may try Miralax to help with bowel movements.  We will call you in May 2021 to schedule the mammogram and follow up with Dr.Rodenberg.  Please call our office if you have questions or concerns.   igh-Fiber Diet Fiber, also called dietary fiber, is a type of carbohydrate that is found in fruits, vegetables, whole grains, and beans. A high-fiber diet can have many health benefits. Your health care provider may recommend a high-fiber diet to help:  Prevent constipation. Fiber can make your bowel movements more regular.  Lower your cholesterol.  Relieve the following conditions: ? Swelling of veins in the anus (hemorrhoids). ? Swelling and irritation (inflammation) of specific areas of the digestive tract (uncomplicated diverticulosis). ? A problem of the large intestine (colon) that sometimes causes pain and diarrhea (irritable bowel syndrome, IBS).  Prevent overeating as part of a weight-loss plan.  Prevent heart disease, type 2 diabetes, and certain cancers. What is my plan? The recommended daily fiber intake in grams (g) includes:  59 g for men age 107 or younger.  30 g for men over age 69.  59 g for women age 69 or younger.  59 g for women over age 5. You can get the recommended daily intake of dietary fiber by:  Eating a variety of fruits, vegetables, grains, and beans.  Taking a fiber supplement, if it is not possible to get enough fiber through your diet. What do I need to know about a high-fiber diet?  It is better to get fiber through food sources rather than from fiber supplements. There is not a lot of research about how effective supplements are.  Always check the fiber content on the nutrition facts label of any prepackaged food. Look for foods that contain 5 g of fiber or more per serving.  Talk with a diet and nutrition specialist (dietitian) if you have questions about specific foods that are recommended or not recommended for your medical condition, especially  if those foods are not listed below.  Gradually increase how much fiber you consume. If you increase your intake of dietary fiber too quickly, you may have bloating, cramping, or gas.  Drink plenty of water. Water helps you to digest fiber. What are tips for following this plan?  Eat a wide variety of high-fiber foods.  Make sure that half of the grains that you eat each day are whole grains.  Eat breads and cereals that are made with whole-grain flour instead of refined flour or white flour.  Eat brown rice, bulgur wheat, or millet instead of white rice.  Start the day with a breakfast that is high in fiber, such as a cereal that contains 5 g of fiber or more per serving.  Use beans in place of meat in soups, salads, and pasta dishes.  Eat high-fiber snacks, such as berries, raw vegetables, nuts, and popcorn.  Choose whole fruits and vegetables instead of processed forms like juice or sauce. What foods can I eat?  Fruits Berries. Pears. Apples. Oranges. Avocado. Prunes and raisins. Dried figs. Vegetables Sweet potatoes. Spinach. Kale. Artichokes. Cabbage. Broccoli. Cauliflower. Green peas. Carrots. Squash. Grains Whole-grain breads. Multigrain cereal. Oats and oatmeal. Brown rice. Barley. Bulgur wheat. [place]. Quinoa. Bran muffins. Popcorn. Rye wafer crackers. Meats and other proteins Navy, kidney, and pinto beans. Soybeans. Split peas. Lentils. Nuts and seeds. Dairy Fiber-fortified yogurt. Beverages Fiber-fortified soy milk. Fiber-fortified orange juice. Other foods Fiber bars. The items listed above may not be a complete list of recommended foods  and beverages. Contact a dietitian for more options. What foods are not recommended? Fruits Fruit juice. Cooked, strained fruit. Vegetables Fried potatoes. Canned vegetables. Well-cooked vegetables. Grains White bread. Pasta made with refined flour. White rice. Meats and other proteins Fatty cuts of meat. Fried chicken or  fried fish. Dairy Milk. Yogurt. Cream cheese. Sour cream. Fats and oils Butters. Beverages Soft drinks. Other foods Cakes and pastries. The items listed above may not be a complete list of foods and beverages to avoid. Contact a dietitian for more information. Summary  Fiber is a type of carbohydrate. It is found in fruits, vegetables, whole grains, and beans.  There are many health benefits of eating a high-fiber diet, such as preventing constipation, lowering blood cholesterol, helping with weight loss, and reducing your risk of heart disease, diabetes, and certain cancers.  Gradually increase your intake of fiber. Increasing too fast can result in cramping, bloating, and gas. Drink plenty of water while you increase your fiber.  The best sources of fiber include whole fruits and vegetables, whole grains, nuts, seeds, and beans. This information is not intended to replace advice given to you by your health care provider. Make sure you discuss any questions you have with your health care provider. Document Released: 05/20/2005 Document Revised: 03/24/2017 Document Reviewed: 03/24/2017 Elsevier Patient Education  2020 [employer].

## 2019-05-20 NOTE — Progress Notes (Addendum)
[facility] POST-OP OFFICE VISIT  05/20/2019  HPI: Kathy Ruiz is a 58 y.o. female 8 days s/p needle localized left breast biopsy.  Pathology report was benign.  Reasons for biopsy were persistent radial scar on follow-up imaging, essentially unchanged for the interval.  Reports rash over left upper torso.  Vital signs: BP 130/81   Pulse (!) 59   Temp 97.9 F (36.6 C) (Temporal)   Resp 12   Ht 5\' 2"  (1.575 m)   Wt 142 lb 6.4 oz (64.6 kg)   SpO2 98%   BMI 26.05 kg/m    Physical Exam: Constitutional: Looks well, appears to be free of any pain or discomfort. Breast: Upper outer quadrant incisions clean dry and intact.  They were concerned about it being larger than anticipated.  No evidence of ecchymosis, erythema or tenderness. Skin: Diffuse nonraised slightly erythematous diffuse fine spots of the upper left chest, suspect reaction to ChloraPrep.  No erosions, no remarkable edema or induration.  Assessment/Plan: This is a 58 y.o. female 8 days s/p left breast needle localized lumpectomy for benign disease.  History of DCIS right breast, no evidence of disease.   -We discussed follow-up imaging, left breast diagnostic mammogram in 6 months with follow-up as needed at that point.  Likely annual follow-up with imaging.   Ronny Bacon

## 2019-09-02 NOTE — Progress Notes (Signed)
[place]  [phone] [phone]) [phone]  ID: Kathy Ruiz OB: 11-19-1960  MR#: [id number]  ESP#:[id number]  Patient Care Team: Leonel Ramsay, MD as PCP - General (Infectious Diseases)  CHIEF COMPLAINT: DCIS right breast, history of papillary thyroid cancer.  INTERVAL HISTORY: Patient returns to clinic today for routine 21-monthevaluation.  In December 2020 she underwent repeat lumpectomy, final pathology was was a benign radical scar.  She otherwise has felt well.  She continues to tolerate letrozole without significant side effects.  She has no neurologic complaints.  She denies any recent fevers or illnesses.  She has a good appetite and denies weight loss.  She has no chest pain, shortness of breath, cough, or hemoptysis.  She has no nausea, vomiting, constipation, diarrhea.  She has no urinary complaints.  Patient offers no specific complaints today.  REVIEW OF SYSTEMS:   Review of Systems  Constitutional: Negative.  Negative for fever, malaise/fatigue and weight loss.  Respiratory: Negative.  Negative for cough, hemoptysis and shortness of breath.   Cardiovascular: Negative.  Negative for chest pain and leg swelling.  Gastrointestinal: Negative.  Negative for abdominal pain.  Genitourinary: Negative.  Negative for frequency.  Musculoskeletal: Negative.  Negative for back pain.  Skin: Negative.  Negative for rash.  Neurological: Negative.  Negative for dizziness, focal weakness, weakness and headaches.  Psychiatric/Behavioral: Negative.  The patient is not nervous/anxious.     As per HPI. Otherwise, a complete review of systems is negative.  PAST MEDICAL HISTORY: Past Medical History:  Diagnosis Date  . Breast cancer ([facility]   . Bronchitis   . Thyroid cancer ([facility]     PAST SURGICAL HISTORY: Past Surgical History:  Procedure Laterality Date  . BREAST BIOPSY Right 2018   dcis  . BREAST BIOPSY Left 11/2016   MRI bx Sclerosing  Adenosis  . BREAST BIOPSY Left 03/2018   stereo radial sclerosing lesion, fibroadenomatoid lesion  . BREAST BIOPSY Left 05/12/2019   Procedure: BREAST BIOPSY WITH NEEDLE LOCALIZATION;  Surgeon: RRonny Bacon MD;  Location: [facility] ORS;  Service: General;  Laterality: Left;  . BREAST EXCISIONAL BIOPSY Left    age 59 . BREAST EXCISIONAL BIOPSY Left 05/12/2019   Needle Placement  . BREAST LUMPECTOMY Right 2018   dcis  . THYROID SURGERY    . TONSILLECTOMY      FAMILY HISTORY: Family History  Problem Relation Age of Onset  . Lung cancer Mother   . Throat cancer Maternal Aunt   . Breast cancer Cousin 579   ADVANCED DIRECTIVES (Y/N):  N  HEALTH MAINTENANCE: Social History   Tobacco Use  . Smoking status: Never Smoker  . Smokeless tobacco: Never Used  Substance Use Topics  . Alcohol use: Yes    Alcohol/week: 2.0 standard drinks    Types: 1 Glasses of wine, 1 Shots of liquor per week    Comment: mixture, 1 per wk   . Drug use: Never     Colonoscopy:  PAP:  Bone density:  Lipid panel:  Allergies  Allergen Reactions  . Ibuprofen     Bleeding ulcer    Current Outpatient Medications  Medication Sig Dispense Refill  . albuterol (PROAIR HFA) 108 (90 Base) MCG/ACT inhaler Inhale 1-2 puffs into the lungs every 6 (six) hours as needed for wheezing or shortness of breath.     . ALPRAZolam (XANAX) 0.25 MG tablet Take 0.25 mg by mouth at bedtime as needed for anxiety.    . Ascorbic Acid (  VITAMIN C) 1000 MG tablet Take 1,000 mg by mouth daily.    . Biotin 1000 MCG tablet Take 1,000 mcg by mouth daily.     Marland Kitchen CALCIUM-MAGNESIUM PO Take 1 tablet by mouth daily.     . Cholecalciferol (VITAMIN D) 125 MCG (5000 UT) CAPS Take 5,000 Units by mouth daily.    . Coenzyme Q10 (CO Q 10) 100 MG CAPS Take 100 mg by mouth daily.     Marland Kitchen letrozole (FEMARA) 2.5 MG tablet Take 1 tablet (2.5 mg total) by mouth daily. 90 tablet 3  . levothyroxine (LEVOXYL) 100 MCG tablet Take 100 mcg by mouth daily  before breakfast.    . Multiple Vitamins-Minerals (CENTRUM ADULTS PO) Take 1 tablet by mouth daily.     . Omega-3 Fatty Acids (FISH OIL) 1200 MG CAPS Take 1,200 mg by mouth daily.     . psyllium (METAMUCIL) 58.6 % powder Take 1 packet by mouth daily.     No current facility-administered medications for this visit.    OBJECTIVE: Vitals:   09/06/19 1406  BP: 123/76  Pulse: 62  Resp: 18  Temp: (!) 96.4 F (35.8 C)  SpO2: 99%     Body mass index is 26.59 kg/m.    ECOG FS:0 - Asymptomatic  General: Well-developed, well-nourished, no acute distress. Eyes: Pink conjunctiva, anicteric sclera. HEENT: Normocephalic, moist mucous membranes. Breast: Exam deferred today. Lungs: No audible wheezing or coughing. Heart: Regular rate and rhythm. Abdomen: Soft, nontender, no obvious distention. Musculoskeletal: No edema, cyanosis, or clubbing. Neuro: Alert, answering all questions appropriately. Cranial nerves grossly intact. Skin: No rashes or petechiae noted. Psych: Normal affect.   LAB RESULTS:  No results found for: NA, K, CL, CO2, GLUCOSE, BUN, CREATININE, CALCIUM, PROT, ALBUMIN, AST, ALT, ALKPHOS, BILITOT, GFRNONAA, GFRAA  No results found for: WBC, NEUTROABS, HGB, HCT, MCV, PLT   STUDIES: No results found.  ASSESSMENT: DCIS right breast, history of papillary thyroid cancer.  PLAN:    1.  DCIS right breast: Patient was diagnosed in 2018 and underwent right lumpectomy followed by adjuvant XRT in May 2018.  She was subsequently placed on letrozole and will require 5 years of treatment completing in May 2023.  Lumpectomy in December 2020 benign radial scar.  She will require repeat mammogram in June 2021.  Return to clinic in 6 months with video assisted telemedicine visit.   2.  History of papillary thyroid cancer: Initially diagnosed in 1999 and patient had 2 re-excisions since then most recently in 2004.  She also reports she underwent radioactive iodine treatments.  No  intervention is needed at this time.  Patient reports her primary care physician will monitor thyroglobulin antibodies as well as TSH and thyroid panel. 3.  Osteopenia: Patient had a bone mineral density on March 01, 2019 that reported a T score of -1.4. Continue calcium and vitamin D.  Repeat in September 2021.  Patient expressed understanding and was in agreement with this plan. She also understands that She can call clinic at any time with any questions, concerns, or complaints.   Cancer Staging Ductal carcinoma in situ (DCIS) of right breast Staging form: Breast, AJCC 8th Edition - Clinical stage from 02/26/2019: Stage 0 (cTis (DCIS), cN0, cM0, ER+, PR: Unknown, HER2: Unknown) - Signed by Lloyd Huger, MD on 02/26/2019   Lloyd Huger, MD   09/07/2019 6:49 AM

## 2019-09-06 ENCOUNTER — Encounter: Payer: Self-pay | Admitting: Oncology

## 2019-09-06 ENCOUNTER — Other Ambulatory Visit: Payer: Self-pay

## 2019-09-06 ENCOUNTER — Inpatient Hospital Stay: Payer: BC Managed Care – PPO | Attending: Oncology | Admitting: Oncology

## 2019-09-06 VITALS — BP 123/76 | HR 62 | Temp 96.4°F | Resp 18 | Wt 145.4 lb

## 2019-09-06 DIAGNOSIS — M8580 Other specified disorders of bone density and structure, unspecified site: Secondary | ICD-10-CM | POA: Insufficient documentation

## 2019-09-06 DIAGNOSIS — Z85850 Personal history of malignant neoplasm of thyroid: Secondary | ICD-10-CM | POA: Insufficient documentation

## 2019-09-06 DIAGNOSIS — D0511 Intraductal carcinoma in situ of right breast: Secondary | ICD-10-CM | POA: Insufficient documentation

## 2019-09-06 DIAGNOSIS — Z79811 Long term (current) use of aromatase inhibitors: Secondary | ICD-10-CM | POA: Insufficient documentation

## 2019-09-06 DIAGNOSIS — Z170 Estrogen receptor positive status [ER+]: Secondary | ICD-10-CM | POA: Diagnosis not present

## 2019-09-06 MED ORDER — LETROZOLE 2.5 MG PO TABS: 2.5000 mg | ORAL_TABLET | Freq: Every day | ORAL | 3 refills | Status: DC

## 2019-09-06 NOTE — Progress Notes (Signed)
Patient is here for 6 month follow up. Denies any pain, changes, or concerns. Would like to discuss Femara prescription.

## 2019-09-14 ENCOUNTER — Inpatient Hospital Stay: Payer: BC Managed Care – PPO | Admitting: Oncology

## 2019-09-28 ENCOUNTER — Other Ambulatory Visit: Payer: Self-pay

## 2019-09-28 DIAGNOSIS — N6489 Other specified disorders of breast: Secondary | ICD-10-CM

## 2019-10-27 ENCOUNTER — Other Ambulatory Visit: Payer: Self-pay

## 2020-02-14 ENCOUNTER — Encounter: Payer: Self-pay | Admitting: Oncology

## 2020-02-14 ENCOUNTER — Other Ambulatory Visit: Payer: Self-pay | Admitting: Oncology

## 2020-02-14 DIAGNOSIS — Z1231 Encounter for screening mammogram for malignant neoplasm of breast: Secondary | ICD-10-CM

## 2020-02-16 ENCOUNTER — Other Ambulatory Visit: Payer: Self-pay | Admitting: Oncology

## 2020-02-16 DIAGNOSIS — Z853 Personal history of malignant neoplasm of breast: Secondary | ICD-10-CM

## 2020-03-09 NOTE — Progress Notes (Signed)
[place]  [phone]) [phone] [phone]) [phone]  ID: Cathyrn Deas OB: January 05, 1961  MR#: [id number]  OHF#:[id number]  Patient Care Team: Leonel Ramsay, MD as PCP - General (Infectious Diseases)  I connected with Dennard Schaumann on 03/14/20 at  2:45 PM EDT by video enabled telemedicine visit and verified that I am speaking with the correct person using two identifiers.   I discussed the limitations, risks, security and privacy concerns of performing an evaluation and management service by telemedicine and the availability of in-person appointments. I also discussed with the patient that there may be a patient responsible charge related to this service. The patient expressed understanding and agreed to proceed.   Other persons participating in the visit and their role in the encounter: Patient, MD.  Patient's location: Home. Provider's location: Clinic.  CHIEF COMPLAINT: DCIS right breast, history of papillary thyroid cancer.  INTERVAL HISTORY: Patient agreed to video assisted telemedicine visit for routine 71-monthevaluation.  She currently feels well and is asymptomatic.  She continues to tolerate letrozole without significant side effects. She has no neurologic complaints.  She denies any recent fevers or illnesses.  She has a good appetite and denies weight loss.  She has no chest pain, shortness of breath, cough, or hemoptysis.  She has no nausea, vomiting, constipation, diarrhea.  She has no urinary complaints.  Patient feels at her baseline offers no specific complaints today.  REVIEW OF SYSTEMS:   Review of Systems  Constitutional: Negative.  Negative for fever, malaise/fatigue and weight loss.  Respiratory: Negative.  Negative for cough, hemoptysis and shortness of breath.   Cardiovascular: Negative.  Negative for chest pain and leg swelling.  Gastrointestinal: Negative.  Negative for abdominal pain.  Genitourinary: Negative.  Negative for  frequency.  Musculoskeletal: Negative.  Negative for back pain.  Skin: Negative.  Negative for rash.  Neurological: Negative.  Negative for dizziness, focal weakness, weakness and headaches.  Psychiatric/Behavioral: Negative.  The patient is not nervous/anxious.     As per HPI. Otherwise, a complete review of systems is negative.  PAST MEDICAL HISTORY: Past Medical History:  Diagnosis Date  . Breast cancer ([facility]   . Bronchitis   . [place]     PAST SURGICAL HISTORY: Past Surgical History:  Procedure Laterality Date  . BREAST BIOPSY Right 2018   dcis  . BREAST BIOPSY Left 11/2016   MRI bx Sclerosing Adenosis  . BREAST BIOPSY Left 03/2018   stereo radial sclerosing lesion, fibroadenomatoid lesion  . BREAST BIOPSY Left 05/12/2019   Procedure: BREAST BIOPSY WITH NEEDLE LOCALIZATION;  Surgeon: RRonny Bacon MD;  Location: [facility] ORS;  Service: General;  Laterality: Left;  . BREAST EXCISIONAL BIOPSY Left    age 59 . BREAST EXCISIONAL BIOPSY Left 05/12/2019   Needle Placement  . BREAST LUMPECTOMY Right 2018   dcis  . THYROID SURGERY    . TONSILLECTOMY      FAMILY HISTORY: Family History  Problem Relation Age of Onset  . Lung cancer Mother   . Throat cancer Maternal Aunt   . Breast cancer Cousin 581   ADVANCED DIRECTIVES (Y/N):  N  HEALTH MAINTENANCE: Social History   Tobacco Use  . Smoking status: Never Smoker  . Smokeless tobacco: Never Used  Vaping Use  . Vaping Use: Never used  Substance Use Topics  . Alcohol use: Yes    Alcohol/week: 2.0 standard drinks    Types: 1 Glasses of wine, 1 Shots of liquor per week  Comment: mixture, 1 per wk   . Drug use: Never     Colonoscopy:  PAP:  Bone density:  Lipid panel:  Allergies  Allergen Reactions  . Ibuprofen     Bleeding ulcer    Current Outpatient Medications  Medication Sig Dispense Refill  . albuterol (PROAIR HFA) 108 (90 Base) MCG/ACT inhaler Inhale 1-2 puffs into the lungs every 6  (six) hours as needed for wheezing or shortness of breath.     . ALPRAZolam (XANAX) 0.25 MG tablet Take 0.25 mg by mouth at bedtime as needed for anxiety.    . Ascorbic Acid (VITAMIN C) 1000 MG tablet Take 1,000 mg by mouth daily.    . Biotin 1000 MCG tablet Take 1,000 mcg by mouth daily.     Marland Kitchen CALCIUM-MAGNESIUM PO Take 1 tablet by mouth daily.     . Cholecalciferol (VITAMIN D) 125 MCG (5000 UT) CAPS Take 5,000 Units by mouth daily.    . citalopram (CELEXA) 10 MG tablet     . letrozole (FEMARA) 2.5 MG tablet Take 1 tablet (2.5 mg total) by mouth daily. 90 tablet 3  . levothyroxine (LEVOXYL) 100 MCG tablet Take 100 mcg by mouth daily before breakfast.    . Multiple Vitamins-Minerals (CENTRUM ADULTS PO) Take 1 tablet by mouth daily.     . Omega-3 Fatty Acids (FISH OIL) 1200 MG CAPS Take 1,200 mg by mouth daily.     . psyllium (METAMUCIL) 58.6 % powder Take 1 packet by mouth daily.    . Coenzyme Q10 (CO Q 10) 100 MG CAPS Take 100 mg by mouth daily.  (Patient not taking: Reported on 03/14/2020)     No current facility-administered medications for this visit.    OBJECTIVE: There were no vitals filed for this visit.   There is no height or weight on file to calculate BMI.    ECOG FS:0 - Asymptomatic  General: Well-developed, well-nourished, no acute distress. HEENT: Normocephalic. Neuro: Alert, answering all questions appropriately. Cranial nerves grossly intact. Psych: Normal affect.  LAB RESULTS:  No results found for: NA, K, CL, CO2, GLUCOSE, BUN, CREATININE, CALCIUM, PROT, ALBUMIN, AST, ALT, ALKPHOS, BILITOT, GFRNONAA, GFRAA  No results found for: WBC, NEUTROABS, HGB, HCT, MCV, PLT   STUDIES: No results found.  ASSESSMENT: DCIS right breast, history of papillary thyroid cancer.  PLAN:    1.  DCIS right breast: Patient was diagnosed in 2018 and underwent right lumpectomy followed by adjuvant XRT in May 2018.  She was subsequently placed on letrozole and will require 5 years of  treatment completing in May 2023.  Lumpectomy in December 2020 benign radial scar.  Patient's next mammogram scheduled for May 09, 2020.  Return to clinic in 6 months with video assisted telemedicine visit.    2.  History of papillary thyroid cancer: Initially diagnosed in 1999 and patient had 2 re-excisions since then most recently in 2004.  She also reports she underwent radioactive iodine treatments.  No intervention is needed at this time.  Patient reports her primary care physician will monitor thyroglobulin antibodies as well as TSH and thyroid panel. 3.  Osteopenia: Patient had a bone mineral density on March 01, 2019 that reported a T score of -1.4. Continue calcium and vitamin D.  Repeat bone mineral density along with mammogram on May 09, 2020.  I provided 20 minutes of face-to-face video visit time during this encounter which included chart review, counseling, and coordination of care as documented above.   Patient expressed understanding  and was in agreement with this plan. She also understands that She can call clinic at any time with any questions, concerns, or complaints.   Cancer Staging Ductal carcinoma in situ (DCIS) of right breast Staging form: Breast, AJCC 8th Edition - Clinical stage from 02/26/2019: Stage 0 (cTis (DCIS), cN0, cM0, ER+, PR: Unknown, HER2: Unknown) - Signed by Lloyd Huger, MD on 02/26/2019   Lloyd Huger, MD   03/14/2020 3:37 PM

## 2020-03-14 ENCOUNTER — Inpatient Hospital Stay: Payer: BC Managed Care – PPO | Attending: Oncology | Admitting: Oncology

## 2020-03-14 ENCOUNTER — Encounter: Payer: Self-pay | Admitting: Oncology

## 2020-03-14 DIAGNOSIS — D0511 Intraductal carcinoma in situ of right breast: Secondary | ICD-10-CM | POA: Diagnosis not present

## 2020-04-06 ENCOUNTER — Telehealth: Payer: Self-pay | Admitting: Surgery

## 2020-04-06 NOTE — Telephone Encounter (Signed)
Advised patient per Dr Christian Mate, he wants to wait until we get mammo results on 12/7. If everything normal then we can switch appt if not then pt will need to come into office. Pt voiced understanding and completely understands and agrees. Has no further concerns at this time.

## 2020-04-06 NOTE — Telephone Encounter (Signed)
Patient is calling and is asking if her visit in December can be a video call instead of the patient coming in for the visit. Please call patient and advise.

## 2020-04-24 ENCOUNTER — Encounter: Payer: Self-pay | Admitting: Oncology

## 2020-05-09 ENCOUNTER — Ambulatory Visit
Admission: RE | Admit: 2020-05-09 | Discharge: 2020-05-09 | Disposition: A | Discharge status: Home / Self Care | Payer: BC Managed Care – PPO | Source: Ambulatory Visit | Attending: Oncology | Admitting: Oncology

## 2020-05-09 ENCOUNTER — Other Ambulatory Visit: Payer: Self-pay

## 2020-05-09 DIAGNOSIS — D0511 Intraductal carcinoma in situ of right breast: Secondary | ICD-10-CM | POA: Insufficient documentation

## 2020-05-09 DIAGNOSIS — Z853 Personal history of malignant neoplasm of breast: Secondary | ICD-10-CM | POA: Diagnosis present

## 2020-05-16 ENCOUNTER — Other Ambulatory Visit: Payer: Self-pay

## 2020-05-16 ENCOUNTER — Encounter: Payer: Self-pay | Admitting: Surgery

## 2020-05-16 ENCOUNTER — Ambulatory Visit (INDEPENDENT_AMBULATORY_CARE_PROVIDER_SITE_OTHER): Payer: BC Managed Care – PPO | Admitting: Surgery

## 2020-05-16 VITALS — BP 137/86 | HR 63 | Temp 98.3°F | Ht 62.0 in | Wt 151.2 lb

## 2020-05-16 DIAGNOSIS — N6489 Other specified disorders of breast: Secondary | ICD-10-CM

## 2020-05-16 NOTE — Progress Notes (Signed)
Having a history of DCIS with breast conservation treatment, from 2018 at [facility].  She had a radial scar excised from the left breast.  She continues to take her Femara.  She has no complaints regarding her breasts, breast pain, skin changes, new masses or nodularity, no nipple discharge.  I reviewed her mammography and concur with the report.  On examination I find neither breast to have any suspicious nodularity, densities or masses.  Scars have healed well without any induration or changes in the skin. She appears well.  She reports that her oncologist will be assuming additional administrative duties, and may no longer be seeing her clinically.  This is rather disappointing for both of [place].  She will continue on her annual screening imaging, and may follow-up with me if she ends up with new other physicians.

## 2020-05-16 NOTE — Patient Instructions (Addendum)
Please call our office if you have questions or concerns. Please call our office if you are unable to follow up with your PCP and we can schedule your mammogram and breast exam with Dr.Rodenberg.  Breast Self-Awareness Breast self-awareness is knowing how your breasts look and feel. Doing breast self-awareness is important. It allows you to catch a breast problem early while it is still small and can be treated. All women should do breast self-awareness, including women who have had breast implants. Tell your doctor if you notice a change in your breasts. What you need:  A mirror.  A well-lit room. How to do a breast self-exam A breast self-exam is one way to learn what is normal for your breasts and to check for changes. To do a breast self-exam: Look for changes  1. Take off all the clothes above your waist. 2. Stand in front of a mirror in a room with good lighting. 3. Put your hands on your hips. 4. Push your hands down. 5. Look at your breasts and nipples in the mirror to see if one breast or nipple looks different from the other. Check to see if: ? The shape of one breast is different. ? The size of one breast is different. ? There are wrinkles, dips, and bumps in one breast and not the other. 6. Look at each breast for changes in the skin, such as: ? Redness. ? Scaly areas. 7. Look for changes in your nipples, such as: ? Liquid around the nipples. ? Bleeding. ? Dimpling. ? Redness. ? A change in where the nipples are. Feel for changes  1. Lie on your back on the floor. 2. Feel each breast. To do this, follow these steps: ? Pick a breast to feel. ? Put the arm closest to that breast above your head. ? Use your other arm to feel the nipple area of your breast. Feel the area with the pads of your three middle fingers by making small circles with your fingers. For the first circle, press lightly. For the second circle, press harder. For the third circle, press even  harder. ? Keep making circles with your fingers at the different pressures as you move down your breast. Stop when you feel your ribs. ? Move your fingers a little toward the center of your body. ? Start making circles with your fingers again, this time going up until you reach your collarbone. ? Keep making up-and-down circles until you reach your armpit. Remember to keep using the three pressures. ? Feel the other breast in the same way. 3. Sit or stand in the tub or shower. 4. With soapy water on your skin, feel each breast the same way you did in step 2 when you were lying on the floor. Write down what you find Writing down what you find can help you remember what to tell your doctor. Write down:  What is normal for each breast.  Any changes you find in each breast, including: ? The kind of changes you find. ? Whether you have pain. ? Size and location of any lumps.  When you last had your menstrual period. General tips  Check your breasts every month.  If you are breastfeeding, the best time to check your breasts is after you feed your baby or after you use a breast pump.  If you get menstrual periods, the best time to check your breasts is 5-7 days after your menstrual period is over.  With time, you will  become comfortable with the self-exam, and you will begin to know if there are changes in your breasts. Contact a doctor if you:  See a change in the shape or size of your breasts or nipples.  See a change in the skin of your breast or nipples, such as red or scaly skin.  Have fluid coming from your nipples that is not normal.  Find a lump or thick area that was not there before.  Have pain in your breasts.  Have any concerns about your breast health. Summary  Breast self-awareness includes looking for changes in your breasts, as well as feeling for changes within your breasts.  Breast self-awareness should be done in front of a mirror in a well-lit room.  You  should check your breasts every month. If you get menstrual periods, the best time to check your breasts is 5-7 days after your menstrual period is over.  Let your doctor know of any changes you see in your breasts, including changes in size, changes on the skin, pain or tenderness, or fluid from your nipples that is not normal. This information is not intended to replace advice given to you by your health care provider. Make sure you discuss any questions you have with your health care provider. Document Revised: 01/06/2018 Document Reviewed: 01/06/2018 Elsevier Patient Education  [place].

## 2020-07-25 ENCOUNTER — Encounter: Payer: Self-pay | Admitting: Oncology

## 2020-07-25 ENCOUNTER — Other Ambulatory Visit: Payer: Self-pay | Admitting: *Deleted

## 2020-07-25 MED ORDER — LETROZOLE 2.5 MG PO TABS
2.5000 mg | ORAL_TABLET | Freq: Every day | ORAL | 3 refills | Status: DC
Start: 2020-07-25 — End: 2021-03-23

## 2020-07-25 NOTE — Telephone Encounter (Signed)
Patient is requesting Rf on femara. She will run out of the femara before her next apt with Dr. Grayland Ormond. Please consider RF request.

## 2020-09-09 NOTE — Progress Notes (Signed)
[place]  Telephone:(336) [phone] Fax:(336) [phone]  ID: Kathy Ruiz OB: 04/04/1961  MR#: [id number]  BUY#:[id number]  Patient Care Team: Gladstone Lighter, MD as PCP - General (Internal Medicine)  I connected with Kathy Ruiz on 09/13/20 at  2:30 PM EDT by video enabled telemedicine visit and verified that I am speaking with the correct person using two identifiers.   I discussed the limitations, risks, security and privacy concerns of performing an evaluation and management service by telemedicine and the availability of in-person appointments. I also discussed with the patient that there may be a patient responsible charge related to this service. The patient expressed understanding and agreed to proceed.   Other persons participating in the visit and their role in the encounter: Patient, MD.  Patient's location: Home. Provider's location: Clinic.  CHIEF COMPLAINT: DCIS right breast, history of papillary thyroid cancer.  INTERVAL HISTORY: Patient agreed to video assisted telemedicine visit for routine 43-monthevaluation.  She continues to tolerate letrozole well without significant side effects.  She currently feels well and is asymptomatic. She has no neurologic complaints.  She denies any recent fevers or illnesses.  She has a good appetite and denies weight loss.  She has no chest pain, shortness of breath, cough, or hemoptysis.  She has no nausea, vomiting, constipation, diarrhea.  She has no urinary complaints.  Patient offers no specific complaints today.  REVIEW OF SYSTEMS:   Review of Systems  Constitutional: Negative.  Negative for fever, malaise/fatigue and weight loss.  Respiratory: Negative.  Negative for cough, hemoptysis and shortness of breath.   Cardiovascular: Negative.  Negative for chest pain and leg swelling.  Gastrointestinal: Negative.  Negative for abdominal pain.  Genitourinary: Negative.  Negative for frequency.   Musculoskeletal: Negative.  Negative for back pain.  Skin: Negative.  Negative for rash.  Neurological: Negative.  Negative for dizziness, focal weakness, weakness and headaches.  Psychiatric/Behavioral: Negative.  The patient is not nervous/anxious.     As per HPI. Otherwise, a complete review of systems is negative.  PAST MEDICAL HISTORY: Past Medical History:  Diagnosis Date  . [place]   . Bronchitis   . [place] ([facility]     PAST SURGICAL HISTORY: Past Surgical History:  Procedure Laterality Date  . BREAST BIOPSY Right 2018   dcis  . BREAST BIOPSY Left 11/2016   MRI bx Sclerosing Adenosis  . BREAST BIOPSY Left 03/2018   stereo radial sclerosing lesion, fibroadenomatoid lesion  . BREAST BIOPSY Left 05/12/2019   Procedure: BREAST BIOPSY WITH NEEDLE LOCALIZATION;  Surgeon: RRonny Bacon MD;  Location: [facility] ORS;  Service: General;  Laterality: Left;  . BREAST EXCISIONAL BIOPSY Left    age 89 . BREAST EXCISIONAL BIOPSY Left 05/12/2019   Needle Placement  . BREAST LUMPECTOMY Right 2018   dcis  . BREAST LUMPECTOMY Left 2020   Radial scar  . THYROID SURGERY    . TONSILLECTOMY      FAMILY HISTORY: Family History  Problem Relation Age of Onset  . Lung cancer Mother   . Throat cancer Maternal Aunt   . Breast cancer Cousin 548   ADVANCED DIRECTIVES (Y/N):  N  HEALTH MAINTENANCE: Social History   Tobacco Use  . Smoking status: Never Smoker  . Smokeless tobacco: Never Used  Vaping Use  . Vaping Use: Never used  Substance Use Topics  . Alcohol use: Yes    Alcohol/week: 2.0 standard drinks    Types: 1 Glasses of wine, 1  Shots of liquor per week    Comment: mixture, 1 per wk   . Drug use: Never     Colonoscopy:  PAP:  Bone density:  Lipid panel:  Allergies  Allergen Reactions  . Ibuprofen     Bleeding ulcer    Current Outpatient Medications  Medication Sig Dispense Refill  . albuterol (VENTOLIN HFA) 108 (90 Base) MCG/ACT inhaler  Inhale 1-2 puffs into the lungs every 6 (six) hours as needed for wheezing or shortness of breath.     . ALPRAZolam (XANAX) 0.25 MG tablet Take 0.25 mg by mouth at bedtime as needed for anxiety.    . Ascorbic Acid (VITAMIN C) 1000 MG tablet Take 1,000 mg by mouth daily.    . Biotin 1000 MCG tablet Take 1,000 mcg by mouth daily.     Marland Kitchen CALCIUM-MAGNESIUM PO Take 1 tablet by mouth daily.     . Cholecalciferol (VITAMIN D) 125 MCG (5000 UT) CAPS Take 5,000 Units by mouth daily.    Marland Kitchen letrozole (FEMARA) 2.5 MG tablet Take 1 tablet (2.5 mg total) by mouth daily. 90 tablet 3  . levothyroxine (SYNTHROID) 100 MCG tablet Take 100 mcg by mouth daily before breakfast.    . Multiple Vitamins-Minerals (CENTRUM ADULTS PO) Take 1 tablet by mouth daily.     . Omega-3 Fatty Acids (FISH OIL) 1200 MG CAPS Take 1,200 mg by mouth daily.     . psyllium (METAMUCIL) 58.6 % powder Take 1 packet by mouth daily.     No current facility-administered medications for this visit.    OBJECTIVE: There were no vitals filed for this visit.   There is no height or weight on file to calculate BMI.    ECOG FS:0 - Asymptomatic  General: Well-developed, well-nourished, no acute distress. HEENT: Normocephalic. Neuro: Alert, answering all questions appropriately. Cranial nerves grossly intact. Psych: Normal affect.   LAB RESULTS:  No results found for: NA, K, CL, CO2, GLUCOSE, BUN, CREATININE, CALCIUM, PROT, ALBUMIN, AST, ALT, ALKPHOS, BILITOT, GFRNONAA, GFRAA  No results found for: WBC, NEUTROABS, HGB, HCT, MCV, PLT   STUDIES: No results found.  ASSESSMENT: DCIS right breast, history of papillary thyroid cancer.  PLAN:    1.  DCIS right breast: Patient was diagnosed in 2018 and underwent right lumpectomy followed by adjuvant XRT in May 2018.  She was subsequently placed on letrozole and will require 5 years of treatment completing in May 2023.  Lumpectomy in December 2020 revealed a benign radial scar.  Her most recent  mammogram on May 09, 2020 was reported as BI-RADS 2.  Repeat in December 2022.  Return to clinic in 6 months with video assisted telemedicine visit.      2.  History of papillary thyroid cancer: Initially diagnosed in 1999 and patient had 2 re-excisions since then most recently in 2004.  She also reports she underwent radioactive iodine treatments.  No intervention is needed at this time.  Patient reports her primary care physician will monitor thyroglobulin antibodies as well as TSH and thyroid panel. 3.  Osteopenia: Patient's most recent bone mineral density on May 09, 2020 reported T score of -1.6.  This is essentially unchanged from 1 year prior where her T score was reported -1.4.  Continue calcium and vitamin D supplementation.  Repeat in December 2022.     I provided 20 minutes of face-to-face video visit time during this encounter which included chart review, counseling, and coordination of care as documented above.   Patient expressed understanding  and was in agreement with this plan. She also understands that She can call clinic at any time with any questions, concerns, or complaints.   Cancer Staging Ductal carcinoma in situ (DCIS) of right breast Staging form: Breast, AJCC 8th Edition - Clinical stage from 02/26/2019: Stage 0 (cTis (DCIS), cN0, cM0, ER+, PR: Unknown, HER2: Unknown) - Signed by Lloyd Huger, MD on 02/26/2019 Stage prefix: Initial diagnosis   Lloyd Huger, MD   09/13/2020 9:47 AM

## 2020-09-12 ENCOUNTER — Encounter: Payer: Self-pay | Admitting: Oncology

## 2020-09-12 ENCOUNTER — Inpatient Hospital Stay: Payer: BC Managed Care – PPO | Attending: Oncology | Admitting: Oncology

## 2020-09-12 DIAGNOSIS — D0511 Intraductal carcinoma in situ of right breast: Secondary | ICD-10-CM | POA: Diagnosis not present

## 2021-02-12 IMAGING — MG DIGITAL DIAGNOSTIC BILAT W/ TOMO W/ CAD
6 of 9 series · 6 of 25 positions shown · non-contrast
Comparison: Previous exam(s).

CLINICAL DATA: Malignant lumpectomy of the RIGHT breast in 0274 for
DCIS. High risk excisional biopsy of the LEFT breast in 2525 for a
radial scar/complex sclerosing lesion. Annual evaluation.

EXAM:
DIGITAL DIAGNOSTIC BILATERAL MAMMOGRAM WITH TOMO AND CAD

[R MLO]
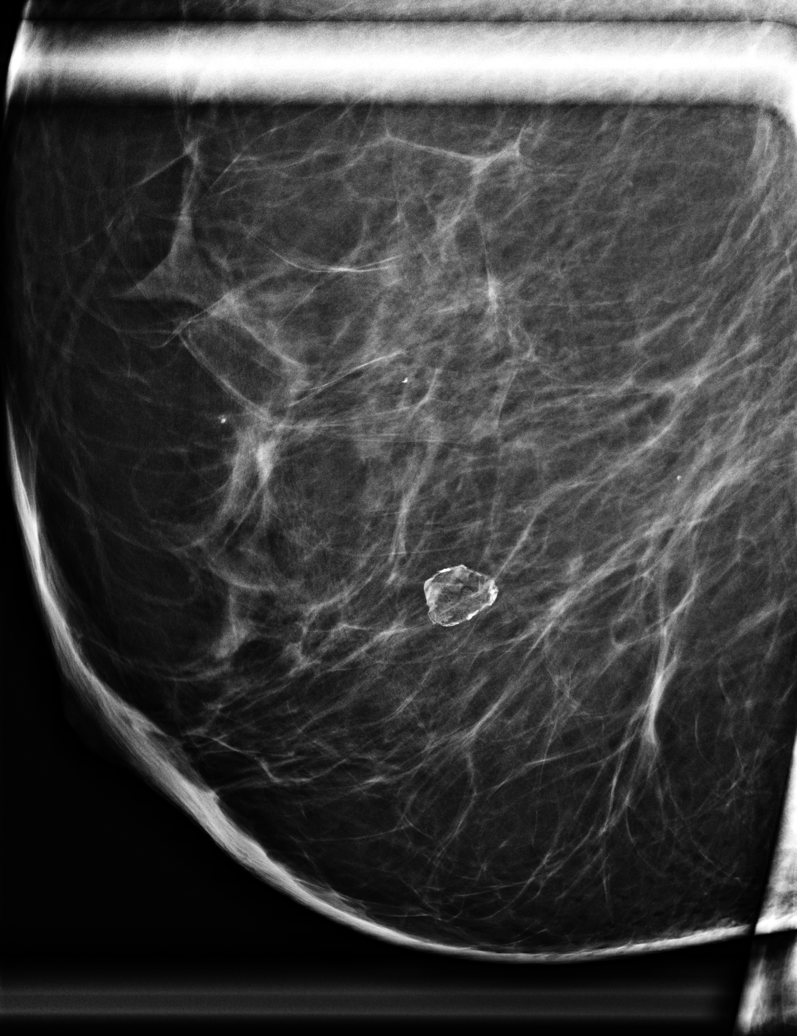

[L CC synth-2D]
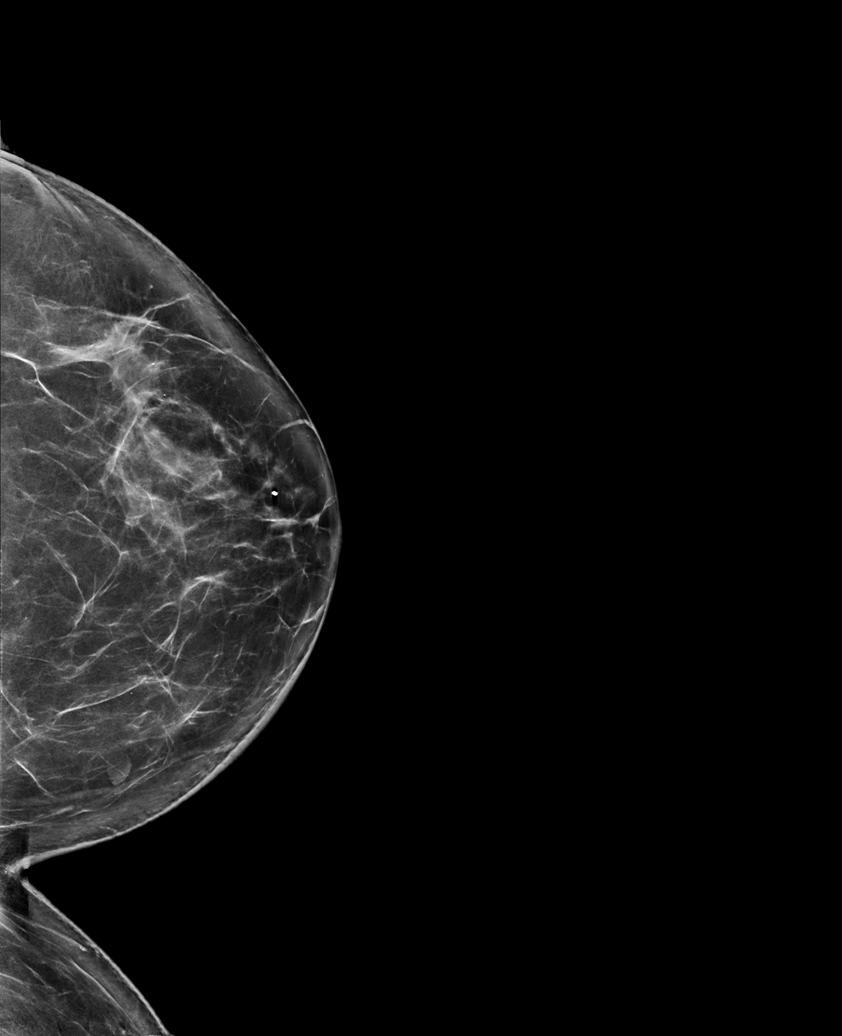

[R MLO synth-2D]
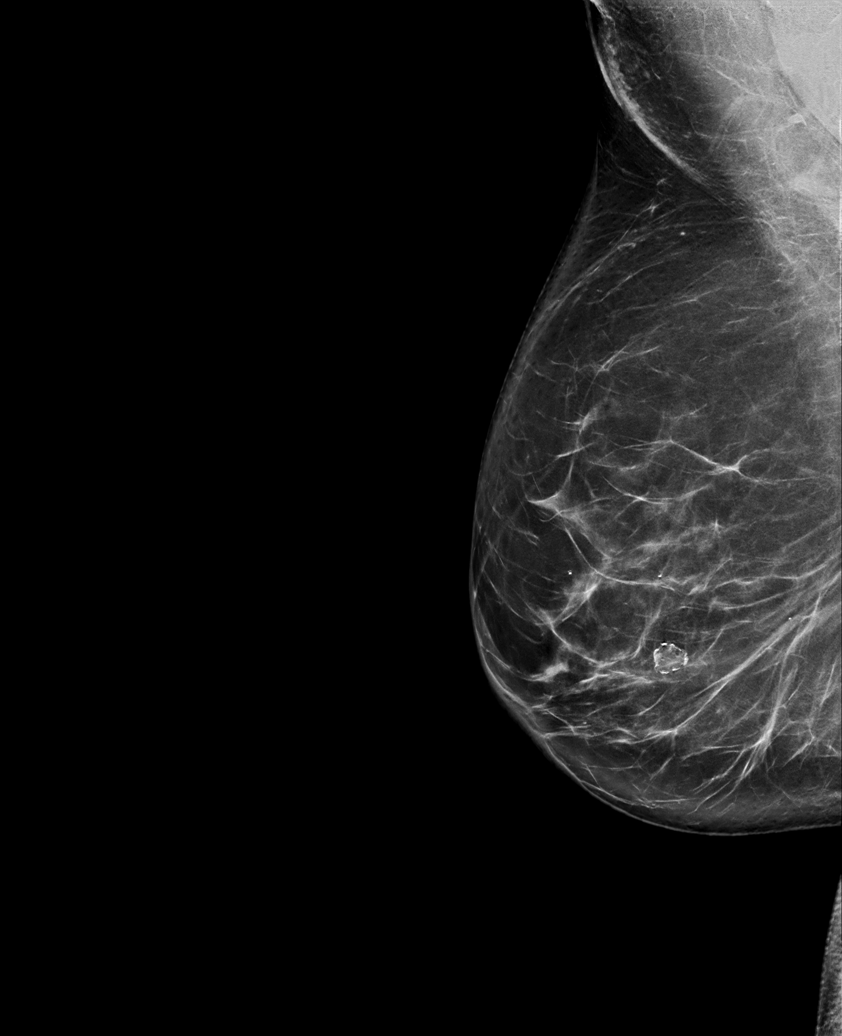

[R CC synth-2D]
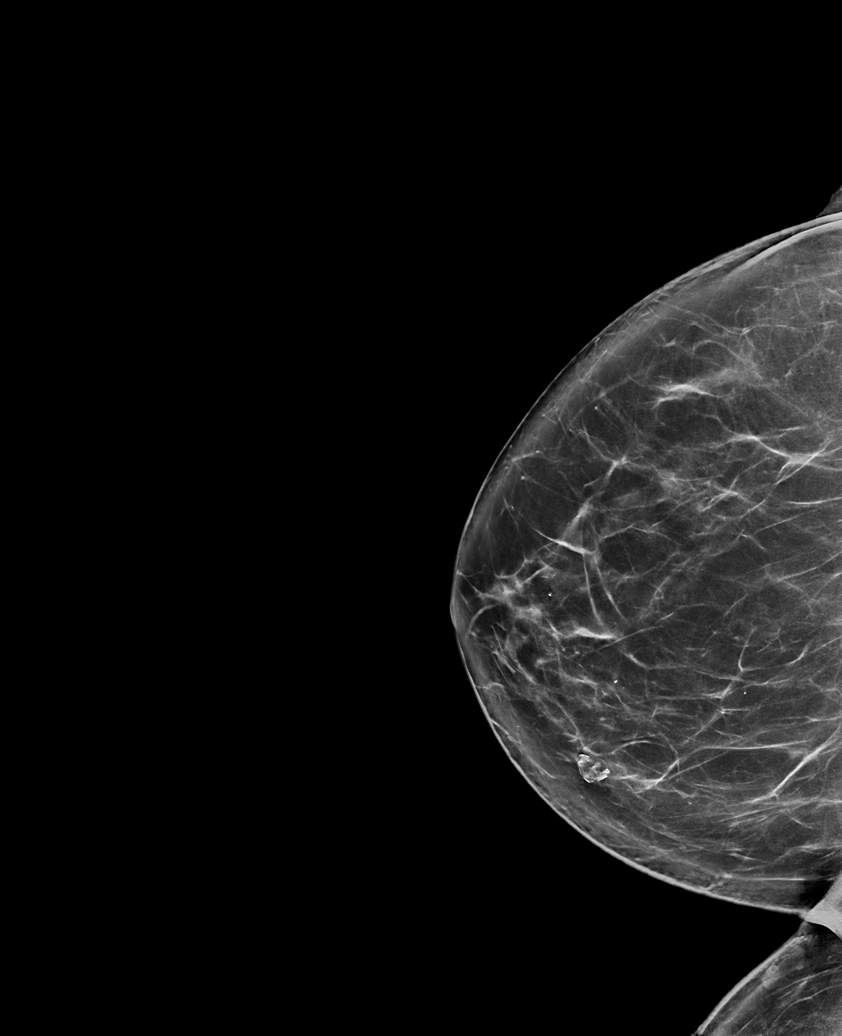

[L MLO synth-2D]
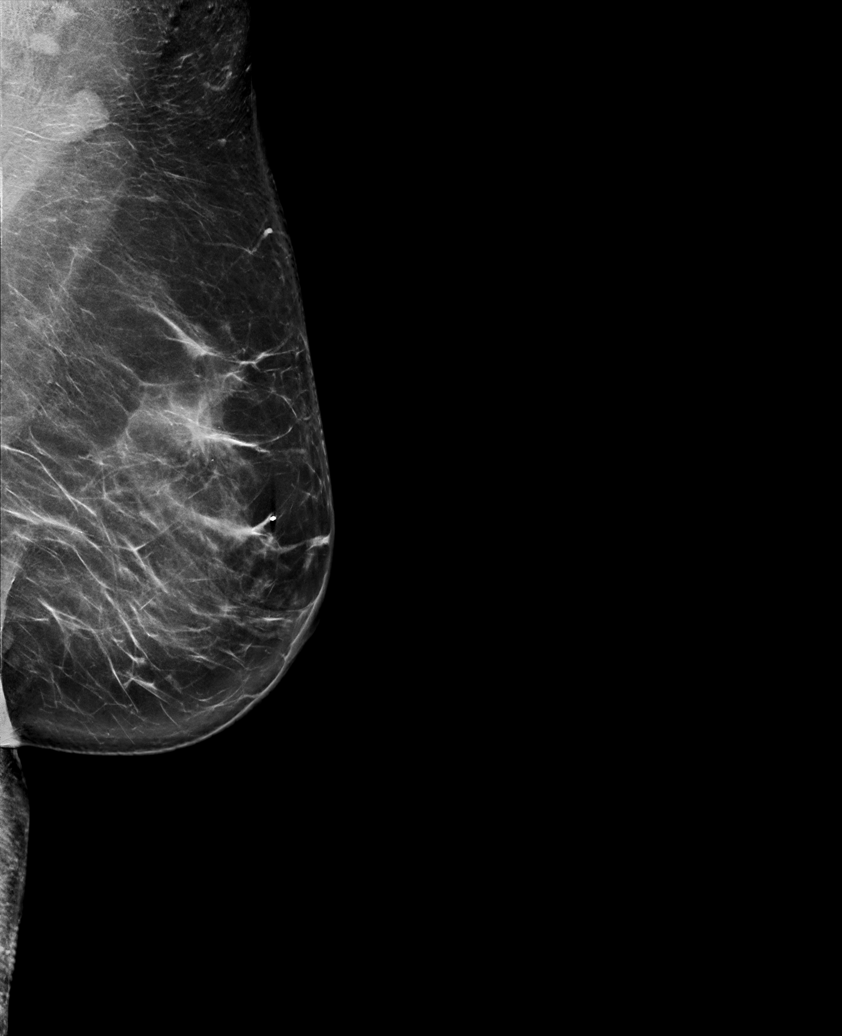

[R MLO tomo · tomo slice 44/87.0]
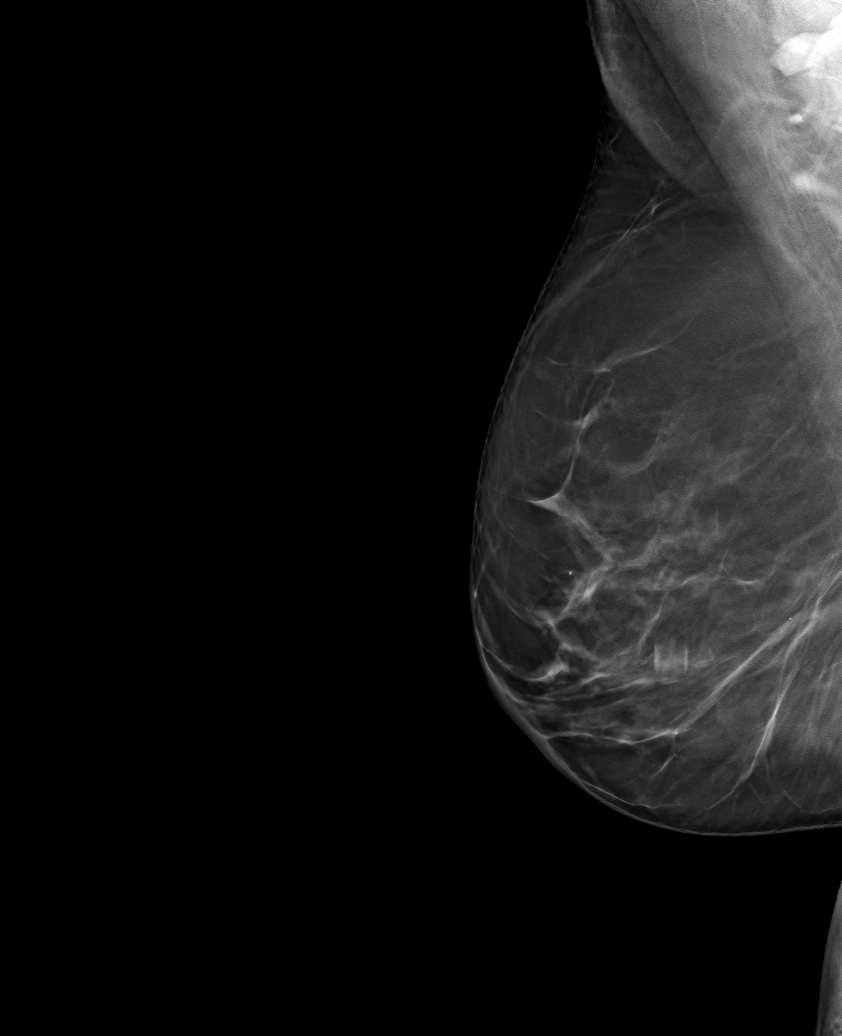

[6 of 25 positions shown; findings below may reference images not displayed]

ACR Breast Density Category b: There are scattered areas of
fibroglandular density.
FINDINGS: Tomosynthesis and synthesized full field CC and MLO views of both
breasts were obtained. Digital 2D spot magnification MLO view of the
lumpectomy site in the RIGHT breast was also obtained. Mammographic
images were processed with CAD.

Minimal scar/architectural distortion at the lumpectomy site in the
INNER RIGHT breast. Benign oil cyst with associated dystrophic
calcifications at the lumpectomy site. No new or suspicious findings
in the RIGHT breast.

Minimal scar/architectural distortion at the site of the excisional
biopsy of the complex sclerosing lesion in the UPPER OUTER QUADRANT
of the LEFT breast. No new or suspicious findings in the LEFT
breast.
IMPRESSION: 1. No mammographic evidence of malignancy involving either breast.
2. Expected post lumpectomy changes in the RIGHT breast and expected
post surgical changes in the LEFT breast.

RECOMMENDATION:
BILATERAL diagnostic mammography in 1 year.

I have discussed the findings and recommendations with the patient.
If applicable, a reminder letter will be sent to the patient
regarding the next appointment.

BI-RADS CATEGORY  2: Benign.

## 2021-03-19 NOTE — Progress Notes (Signed)
[place]  Telephone:(336) [phone] Fax:(336) [phone]  ID: Kathy Ruiz OB: April 22, 1961  MR#: [id number]  SPQ#:[id number]  Patient Care Team: Gladstone Lighter, MD as PCP - General (Internal Medicine)  I connected with Kathy Ruiz on 03/20/21 at  2:30 PM EDT by video enabled telemedicine visit and verified that I am speaking with the correct person using two identifiers.   I discussed the limitations, risks, security and privacy concerns of performing an evaluation and management service by telemedicine and the availability of in-person appointments. I also discussed with the patient that there may be a patient responsible charge related to this service. The patient expressed understanding and agreed to proceed.   Other persons participating in the visit and their role in the encounter: Patient, MD.  Patient's location: Home. Provider's location: Clinic.  CHIEF COMPLAINT: DCIS right breast, history of papillary thyroid cancer.  INTERVAL HISTORY: Patient agreed to video assisted telemedicine visit for her routine 33-monthevaluation.  She continues to tolerate letrozole well without significant side effects.  She currently feels well and is asymptomatic. She has no neurologic complaints.  She denies any recent fevers or illnesses.  She has a good appetite and denies weight loss.  She has no chest pain, shortness of breath, cough, or hemoptysis.  She has no nausea, vomiting, constipation, diarrhea.  She has no urinary complaints.  Patient offers no specific complaints today.  REVIEW OF SYSTEMS:   Review of Systems  Constitutional: Negative.  Negative for fever, malaise/fatigue and weight loss.  Respiratory: Negative.  Negative for cough, hemoptysis and shortness of breath.   Cardiovascular: Negative.  Negative for chest pain and leg swelling.  Gastrointestinal: Negative.  Negative for abdominal pain.  Genitourinary: Negative.  Negative for frequency.   Musculoskeletal: Negative.  Negative for back pain.  Skin: Negative.  Negative for rash.  Neurological: Negative.  Negative for dizziness, focal weakness, weakness and headaches.  Psychiatric/Behavioral: Negative.  The patient is not nervous/anxious.    As per HPI. Otherwise, a complete review of systems is negative.  PAST MEDICAL HISTORY: Past Medical History:  Diagnosis Date   [place]    Bronchitis    [place]     PAST SURGICAL HISTORY: Past Surgical History:  Procedure Laterality Date   BREAST BIOPSY Right 2018   dcis   BREAST BIOPSY Left 11/2016   MRI bx Sclerosing Adenosis   BREAST BIOPSY Left 03/2018   stereo radial sclerosing lesion, fibroadenomatoid lesion   BREAST BIOPSY Left 05/12/2019   Procedure: BREAST BIOPSY WITH NEEDLE LOCALIZATION;  Surgeon: RRonny Bacon MD;  Location: [facility] ORS;  Service: General;  Laterality: Left;   BREAST EXCISIONAL BIOPSY Left    age 60  BREAST EXCISIONAL BIOPSY Left 05/12/2019   Needle Placement   BREAST LUMPECTOMY Right 2018   dcis   BREAST LUMPECTOMY Left 2020   Radial scar   THYROID SURGERY     TONSILLECTOMY      FAMILY HISTORY: Family History  Problem Relation Age of Onset   Lung cancer Mother    Throat cancer Maternal Aunt    Breast cancer Cousin 532   ADVANCED DIRECTIVES (Y/N):  N  HEALTH MAINTENANCE: Social History   Tobacco Use   Smoking status: Never   Smokeless tobacco: Never  Vaping Use   Vaping Use: Never used  Substance Use Topics   Alcohol use: Yes    Alcohol/week: 2.0 standard drinks    Types: 1 Glasses of wine, 1 Shots of  liquor per week    Comment: mixture, 1 per wk    Drug use: Never     Colonoscopy:  PAP:  Bone density:  Lipid panel:  Allergies  Allergen Reactions   Ibuprofen     Bleeding ulcer    Current Outpatient Medications  Medication Sig Dispense Refill   albuterol (VENTOLIN HFA) 108 (90 Base) MCG/ACT inhaler Inhale 1-2 puffs into the lungs every 6  (six) hours as needed for wheezing or shortness of breath.      ALPRAZolam (XANAX) 0.25 MG tablet Take 0.25 mg by mouth at bedtime as needed for anxiety.     Ascorbic Acid (VITAMIN C) 1000 MG tablet Take 1,000 mg by mouth daily.     Biotin 1000 MCG tablet Take 1,000 mcg by mouth daily.      CALCIUM-MAGNESIUM PO Take 1 tablet by mouth daily.      Cholecalciferol (VITAMIN D) 125 MCG (5000 UT) CAPS Take 5,000 Units by mouth daily.     letrozole (FEMARA) 2.5 MG tablet Take 1 tablet (2.5 mg total) by mouth daily. 90 tablet 3   levothyroxine (SYNTHROID) 100 MCG tablet Take 100 mcg by mouth daily before breakfast.     Multiple Vitamins-Minerals (CENTRUM ADULTS PO) Take 1 tablet by mouth daily.      Omega-3 Fatty Acids (FISH OIL) 1200 MG CAPS Take 1,200 mg by mouth daily.      psyllium (METAMUCIL) 58.6 % powder Take 1 packet by mouth daily.     No current facility-administered medications for this visit.    OBJECTIVE: There were no vitals filed for this visit.   There is no height or weight on file to calculate BMI.    ECOG FS:0 - Asymptomatic  General: Well-developed, well-nourished, no acute distress. HEENT: Normocephalic. Neuro: Alert, answering all questions appropriately. Cranial nerves grossly intact. Psych: Normal affect.  LAB RESULTS:  No results found for: NA, K, CL, CO2, GLUCOSE, BUN, CREATININE, CALCIUM, PROT, ALBUMIN, AST, ALT, ALKPHOS, BILITOT, GFRNONAA, GFRAA  No results found for: WBC, NEUTROABS, HGB, HCT, MCV, PLT   STUDIES: No results found.  ASSESSMENT: DCIS right breast, history of papillary thyroid cancer.  PLAN:    1.  DCIS right breast: Patient was diagnosed in 2018 and underwent right lumpectomy followed by adjuvant XRT in May 2018.  She was subsequently placed on letrozole and will require 5 years of treatment completing in May 2023.  Repeat lumpectomy in December 2020 revealed a benign radial scar.  Her most recent mammogram on May 09, 2020 was reported as  BI-RADS 2.  Repeat mammogram in December 2022.  Return to clinic in May 2023 at which time she likely can discontinue letrozole and be discharged from clinic.     2.  History of papillary thyroid cancer: Initially diagnosed in 1999 and patient had 2 re-excisions since then most recently in 2004.  She also reports she underwent radioactive iodine treatments.  No intervention is needed at this time.  Patient reports her primary care physician will monitor thyroglobulin antibodies as well as TSH and thyroid panel. 3.  Osteopenia: Patient's most recent bone mineral density on May 09, 2020 reported T score of -1.6.  This is essentially unchanged from 1 year prior where her T score was reported -1.4.  Continue calcium and vitamin D supplementation.  Repeat in December 2022 along with mammogram as above.  I provided 20 minutes of face-to-face video visit time during this encounter which included chart review, counseling, and coordination of care  as documented above.    Patient expressed understanding and was in agreement with this plan. She also understands that She can call clinic at any time with any questions, concerns, or complaints.   Cancer Staging Ductal carcinoma in situ (DCIS) of right breast Staging form: Breast, AJCC 8th Edition - Clinical stage from 02/26/2019: Stage 0 (cTis (DCIS), cN0, cM0, ER+, PR: Unknown, HER2: Unknown) - Signed by Lloyd Huger, MD on 02/26/2019 Stage prefix: Initial diagnosis   Lloyd Huger, MD   03/20/2021 6:55 PM

## 2021-03-20 ENCOUNTER — Other Ambulatory Visit: Payer: Self-pay

## 2021-03-20 ENCOUNTER — Inpatient Hospital Stay: Payer: BC Managed Care – PPO | Attending: Oncology | Admitting: Oncology

## 2021-03-20 DIAGNOSIS — D0511 Intraductal carcinoma in situ of right breast: Secondary | ICD-10-CM | POA: Diagnosis not present

## 2021-03-20 NOTE — Progress Notes (Signed)
Pt confirmed availability for virtual visit as scheduled. Pt denies any concerns at this time.

## 2021-03-23 ENCOUNTER — Other Ambulatory Visit: Payer: Self-pay

## 2021-03-23 ENCOUNTER — Encounter: Payer: Self-pay | Admitting: Oncology

## 2021-03-23 MED ORDER — LETROZOLE 2.5 MG PO TABS: 2.5000 mg | ORAL_TABLET | Freq: Every day | ORAL | 5 refills | Status: AC

## 2021-03-23 NOTE — Telephone Encounter (Signed)
Patient request 30 day supply sent to [facility].

## 2021-05-10 ENCOUNTER — Ambulatory Visit
Admission: RE | Admit: 2021-05-10 | Discharge: 2021-05-10 | Disposition: A | Discharge status: Home / Self Care | Payer: Commercial Managed Care - PPO | Source: Ambulatory Visit | Attending: Oncology | Admitting: Oncology

## 2021-05-10 ENCOUNTER — Other Ambulatory Visit: Payer: Self-pay

## 2021-05-10 DIAGNOSIS — D0511 Intraductal carcinoma in situ of right breast: Secondary | ICD-10-CM | POA: Diagnosis present

## 2021-10-04 ENCOUNTER — Inpatient Hospital Stay: Payer: Self-pay | Attending: Oncology | Admitting: Oncology

## 2021-10-04 ENCOUNTER — Encounter: Payer: Self-pay | Admitting: Oncology

## 2021-10-04 VITALS — BP 122/77 | HR 77 | Temp 96.7°F | Resp 16 | Ht 62.0 in | Wt 153.8 lb

## 2021-10-04 DIAGNOSIS — D0511 Intraductal carcinoma in situ of right breast: Secondary | ICD-10-CM | POA: Insufficient documentation

## 2021-10-04 DIAGNOSIS — Z170 Estrogen receptor positive status [ER+]: Secondary | ICD-10-CM | POA: Insufficient documentation

## 2021-10-04 DIAGNOSIS — M8580 Other specified disorders of bone density and structure, unspecified site: Secondary | ICD-10-CM | POA: Insufficient documentation

## 2021-10-04 DIAGNOSIS — Z85850 Personal history of malignant neoplasm of thyroid: Secondary | ICD-10-CM | POA: Insufficient documentation

## 2021-10-04 NOTE — Progress Notes (Signed)
?[place]  ?Telephone:(336) [phone] [phone]) [phone] ? ?ID: Kathy Ruiz OB: Oct 03, 1960  MR#: [id number]  KWI#:[id number] ? ?Patient Care Team: ?Gladstone Lighter, MD as PCP - General (Internal Medicine) ? ? ?CHIEF COMPLAINT: DCIS right breast, history of papillary thyroid cancer. ? ?INTERVAL HISTORY: Patient returns to clinic today for routine 60-monthevaluation.  She currently feels well and is asymptomatic.  She is tolerating letrozole without significant side effects. She has no neurologic complaints.  She denies any recent fevers or illnesses.  She has a good appetite and denies weight loss.  She has no chest pain, shortness of breath, cough, or hemoptysis.  She has no nausea, vomiting, constipation, diarrhea.  She has no urinary complaints.  Patient offers no specific complaints today. ? ?REVIEW OF SYSTEMS:   ?Review of Systems  ?Constitutional: Negative.  Negative for fever, malaise/fatigue and weight loss.  ?Respiratory: Negative.  Negative for cough, hemoptysis and shortness of breath.   ?Cardiovascular: Negative.  Negative for chest pain and leg swelling.  ?Gastrointestinal: Negative.  Negative for abdominal pain.  ?Genitourinary: Negative.  Negative for frequency.  ?Musculoskeletal: Negative.  Negative for back pain.  ?Skin: Negative.  Negative for rash.  ?Neurological: Negative.  Negative for dizziness, focal weakness, weakness and headaches.  ?Psychiatric/Behavioral: Negative.  The patient is not nervous/anxious.   ? ?As per HPI. Otherwise, a complete review of systems is negative. ? ?PAST MEDICAL HISTORY: ?Past Medical History:  ?Diagnosis Date  ? Breast cancer ([place]   ? Bronchitis   ? [place] cancer ([place]   ? ? ?PAST SURGICAL HISTORY: ?Past Surgical History:  ?Procedure Laterality Date  ? BREAST BIOPSY Right 2018  ? dcis  ? BREAST BIOPSY Left 11/2016  ? MRI bx Sclerosing Adenosis  ? BREAST BIOPSY Left 03/2018  ? stereo radial sclerosing lesion, fibroadenomatoid lesion  ?  BREAST BIOPSY Left 05/12/2019  ? Procedure: BREAST BIOPSY WITH NEEDLE LOCALIZATION;  Surgeon: RRonny Bacon MD;  Location: [facility] ORS;  Service: General;  Laterality: Left;  ? BREAST EXCISIONAL BIOPSY Left   ? age 61 ? BREAST EXCISIONAL BIOPSY Left 05/12/2019  ? Needle Placement  ? BREAST LUMPECTOMY Right 2018  ? dcis  ? BREAST LUMPECTOMY Left 2020  ? Radial scar  ? THYROID SURGERY    ? TONSILLECTOMY    ? ? ?FAMILY HISTORY: ?Family History  ?Problem Relation Age of Onset  ? Lung cancer Mother   ? Throat cancer Maternal Aunt   ? Breast cancer Cousin 521 ? ? ?ADVANCED DIRECTIVES (Y/N):  N ? ?HEALTH MAINTENANCE: ?Social History  ? ?Tobacco Use  ? Smoking status: Never  ? Smokeless tobacco: Never  ?Vaping Use  ? Vaping Use: Never used  ?Substance Use Topics  ? Alcohol use: Yes  ?  Alcohol/week: 2.0 standard drinks  ?  Types: 1 Glasses of wine, 1 Shots of liquor per week  ?  Comment: mixture, 1 per wk   ? Drug use: Never  ? ? ? Colonoscopy: ? PAP: ? Bone density: ? Lipid panel: ? ?Allergies  ?Allergen Reactions  ? Ibuprofen   ?  Bleeding ulcer  ? ? ?Current Outpatient Medications  ?Medication Sig Dispense Refill  ? albuterol (VENTOLIN HFA) 108 (90 Base) MCG/ACT inhaler Inhale 1-2 puffs into the lungs every 6 (six) hours as needed for wheezing or shortness of breath.     ? Ascorbic Acid (VITAMIN C) 1000 MG tablet Take 1,000 mg by mouth daily.    ? CALCIUM-MAGNESIUM PO Take 1 tablet  by mouth daily.     ? Cholecalciferol (VITAMIN D) 125 MCG (5000 UT) CAPS Take 5,000 Units by mouth daily.    ? letrozole (FEMARA) 2.5 MG tablet Take 1 tablet (2.5 mg total) by mouth daily. 30 tablet 5  ? levothyroxine (SYNTHROID) 100 MCG tablet Take 100 mcg by mouth daily before breakfast.    ? Multiple Vitamins-Minerals (CENTRUM ADULTS PO) Take 1 tablet by mouth daily.     ? Omega-3 Fatty Acids (FISH OIL) 1200 MG CAPS Take 1,200 mg by mouth daily.     ? psyllium (METAMUCIL) 58.6 % powder Take 1 packet by mouth daily.    ? ALPRAZolam (XANAX)  0.25 MG tablet Take 0.25 mg by mouth at bedtime as needed for anxiety. (Patient not taking: Reported on 10/04/2021)    ? Biotin 1000 MCG tablet Take 1,000 mcg by mouth daily.  (Patient not taking: Reported on 10/04/2021)    ? ?No current facility-administered medications for this visit.  ? ? ?OBJECTIVE: ?Vitals:  ? 10/04/21 1454  ?BP: 122/77  ?Pulse: 77  ?Resp: 16  ?Temp: (!) 96.7 ?F (35.9 ?C)  ?SpO2: 99%  ?   Body mass index is 28.13 kg/m?Marland Kitchen    ECOG FS:0 - Asymptomatic ? ?General: Well-developed, well-nourished, no acute distress. ?Eyes: Pink conjunctiva, anicteric sclera. ?HEENT: Normocephalic, moist mucous membranes. ?Breast: Exam deferred today. ?Lungs: No audible wheezing or coughing. ?Heart: Regular rate and rhythm. ?Abdomen: Soft, nontender, no obvious distention. ?Musculoskeletal: No edema, cyanosis, or clubbing. ?Neuro: Alert, answering all questions appropriately. Cranial nerves grossly intact. ?Skin: No rashes or petechiae noted. ?Psych: Normal affect. ? ? ?LAB RESULTS: ? ?No results found for: NA, K, CL, CO2, GLUCOSE, BUN, CREATININE, CALCIUM, PROT, ALBUMIN, AST, ALT, ALKPHOS, BILITOT, GFRNONAA, GFRAA ? ?No results found for: WBC, NEUTROABS, HGB, HCT, MCV, PLT ? ? ?STUDIES: ?No results found. ? ?ASSESSMENT: DCIS right breast, history of papillary thyroid cancer. ? ?PLAN:   ? ?1.  DCIS right breast: Patient was diagnosed in 2018 and underwent right lumpectomy followed by adjuvant XRT in May 2018.  She was subsequently placed on letrozole and will require 5 years of treatment completing in May 2023.  She has been instructed to complete her current prescription of letrozole and then discontinue.  Repeat lumpectomy in December 2020 revealed a benign radial scar.  Her most recent mammogram on May 10, 2021 was reported as BI-RADS 1.  Repeat in December 2023.  No further follow-up has been scheduled.  Patient can now have her yearly screening mammograms ordered by primary care.  Please refer patient back if  there are any questions or concerns.      ?2.  History of papillary thyroid cancer: Initially diagnosed in 1999 and patient had 2 re-excisions since then most recently in 2004.  She also reports she underwent radioactive iodine treatments.  No intervention is needed at this time.  Patient reports her primary care physician will monitor thyroglobulin antibodies as well as TSH and thyroid panel. ?3.  Osteopenia: Patient's most recent bone mineral density on May 10, 2021 reported T score of -1.9 which is slightly worse than 1 year prior where her T score was reported -1.6.  Continue calcium and vitamin D supplementation.  Repeat in December 2023 along with mammogram which also can be ordered by primary care physician.   ? ? ?I provided 20 minutes of face-to-face video visit time during this encounter which included chart review, counseling, and coordination of care as documented above. ? ?Patient expressed understanding  and was in agreement with this plan. She also understands that She can call clinic at any time with any questions, concerns, or complaints.  ? ? Cancer Staging  ?Ductal carcinoma in situ (DCIS) of right breast ?Staging form: Breast, AJCC 8th Edition ?- Clinical stage from 02/26/2019: Stage 0 (cTis (DCIS), cN0, cM0, ER+, PR: Unknown, HER2: Unknown) - Signed by Lloyd Huger, MD on 02/26/2019 ?Stage prefix: Initial diagnosis ? ? ?Lloyd Huger, MD   10/05/2021 3:08 PM ? ? ? ? ?

## 2022-03-27 ENCOUNTER — Other Ambulatory Visit: Payer: Self-pay | Admitting: Internal Medicine

## 2022-03-27 DIAGNOSIS — Z1231 Encounter for screening mammogram for malignant neoplasm of breast: Secondary | ICD-10-CM

## 2022-05-13 ENCOUNTER — Ambulatory Visit
Admission: RE | Admit: 2022-05-13 | Discharge: 2022-05-13 | Disposition: A | Discharge status: Home / Self Care | Payer: BC Managed Care – PPO | Source: Ambulatory Visit | Attending: Internal Medicine | Admitting: Internal Medicine

## 2022-05-13 DIAGNOSIS — Z1231 Encounter for screening mammogram for malignant neoplasm of breast: Secondary | ICD-10-CM | POA: Diagnosis present

## 2023-04-10 ENCOUNTER — Other Ambulatory Visit: Payer: Self-pay | Admitting: Internal Medicine

## 2023-04-10 DIAGNOSIS — Z1231 Encounter for screening mammogram for malignant neoplasm of breast: Secondary | ICD-10-CM

## 2023-05-15 ENCOUNTER — Ambulatory Visit
Admission: RE | Admit: 2023-05-15 | Discharge: 2023-05-15 | Disposition: A | Discharge status: Home / Self Care | Payer: BC Managed Care – PPO | Source: Ambulatory Visit | Attending: Internal Medicine | Admitting: Internal Medicine

## 2023-05-15 DIAGNOSIS — Z1231 Encounter for screening mammogram for malignant neoplasm of breast: Secondary | ICD-10-CM | POA: Diagnosis present

## 2023-09-08 ENCOUNTER — Other Ambulatory Visit: Payer: Self-pay | Admitting: Family Medicine

## 2023-09-08 DIAGNOSIS — M5416 Radiculopathy, lumbar region: Secondary | ICD-10-CM

## 2023-09-10 ENCOUNTER — Ambulatory Visit
Admission: RE | Admit: 2023-09-10 | Discharge: 2023-09-10 | Discharge status: Home / Self Care | Source: Ambulatory Visit | Attending: Family Medicine | Admitting: Family Medicine

## 2023-09-10 DIAGNOSIS — M5416 Radiculopathy, lumbar region: Secondary | ICD-10-CM

## 2024-03-31 ENCOUNTER — Other Ambulatory Visit: Payer: Self-pay | Admitting: Internal Medicine

## 2024-03-31 DIAGNOSIS — Z1231 Encounter for screening mammogram for malignant neoplasm of breast: Secondary | ICD-10-CM

## 2024-05-17 ENCOUNTER — Ambulatory Visit
Admission: RE | Admit: 2024-05-17 | Discharge: 2024-05-17 | Disposition: A | Source: Ambulatory Visit | Attending: Internal Medicine | Admitting: Internal Medicine

## 2024-05-17 DIAGNOSIS — Z1231 Encounter for screening mammogram for malignant neoplasm of breast: Secondary | ICD-10-CM
